# Patient Record
Sex: Male | Born: 1985 | Race: White | Hispanic: No | Marital: Single | Smoking: Former smoker
Health system: Southern US, Community
[De-identification: ages and names within clinical notes are randomized; demographics above are authoritative.]

## PROBLEM LIST (undated history)

## (undated) DIAGNOSIS — F17200 Nicotine dependence, unspecified, uncomplicated: Secondary | ICD-10-CM

## (undated) DIAGNOSIS — N200 Calculus of kidney: Secondary | ICD-10-CM

## (undated) DIAGNOSIS — T7840XA Allergy, unspecified, initial encounter: Secondary | ICD-10-CM

## (undated) HISTORY — DX: Nicotine dependence, unspecified, uncomplicated: F17.200

## (undated) HISTORY — PX: HYDROCELE EXCISION: SHX482

## (undated) HISTORY — DX: Allergy, unspecified, initial encounter: T78.40XA

---
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
## 2006-08-23 ENCOUNTER — Emergency Department (HOSPITAL_COMMUNITY): Admission: EM | Admit: 2006-08-23 | Discharge: 2006-08-23 | Payer: Self-pay | Admitting: Emergency Medicine

## 2007-03-07 ENCOUNTER — Ambulatory Visit: Payer: Self-pay | Admitting: Family Medicine

## 2007-06-08 ENCOUNTER — Ambulatory Visit: Payer: Self-pay | Admitting: Family Medicine

## 2008-12-10 ENCOUNTER — Ambulatory Visit: Payer: Self-pay | Admitting: Family Medicine

## 2009-06-03 ENCOUNTER — Ambulatory Visit: Payer: Self-pay | Admitting: Family Medicine

## 2010-09-19 NOTE — Op Note (Signed)
[place]  Patient:    Gabriel Graham, Gabriel Graham                      MRN: [id number] Proc. Date: 11/27/99 Adm. Date:  [id number] Disc. Date: [id number] Attending:  Carlos Levering CC:         Arelia Longest. Zenaida Niece, M.D.                           Operative Report  PREOPERATIVE DIAGNOSES: 1. Large right hydrocele. 2. Possible left hydrocele.  POSTOPERATIVE DIAGNOSES: 1. Large right hydrocele. 2. No evidence of hydrocele on left side, clinically.  OPERATION PERFORMED:  Exploration of right groin, and repair of right hydrocele.  SURGEON:  Dr. Levie Heritage.  ASSISTANT:  Dr. Leeanne Mannan.  ANESTHESIA:  Nurse.  OPERATIVE FINDINGS:  There was a huge right hydrocele measuring about 6 inches x 4 inches with thickened hydrocele sac.  No definite communicating processus vaginalis were identified.  There was no evidence of hernia.  Grossly, testicle appeared normal, and no other abnormalities noted.  DESCRIPTION OF PROCEDURE:  Under satisfactory general anesthesia the patient in supine position, the abdomen and groin regions were thoroughly prepped and draped in the usual manner.  About a 3 inch long transverse incision was made in the right groin and distal skin crease.  Skin and subcutaneous tissue incised.  Bleeders individually clamped, cut, and electrocoagulated.  External oblique opened.  The spermatic cord structures were dissected to identify any evidence of either patent processus vaginalis or hernia sac; none were seen. Distal dissection was carried out to explore the superior pole of the hydrocele.  At this time a needle was introduced and decompression of the large hydrocele was carried out.  About 150 cc of yellowish clear fluid was obtained.  Now the testicle was delivered out of the scrotal sac, and the hydrocele sac was opened.  The excess of the hydrocele sac was excised.  The cut edges were now imbricated with running 4-0 silk interlocking  sutures. Hemostasis was satisfactory.  The area was irrigated.  Testicle returned to the right scrotal pouch.  The inguinal canal was modified Fergusons method with #35 wire interrupted sutures.  Marcaine 0.25% with epinephrine was injected locally for postoperative analgesia.  Subcutaneous with 4-0 Vicryl. Skin closed with 4-0 Monocryl subcuticular sutures.  Steri-Strips applied. Dressing applied.  Throughout the procedure the patients vital signs remained stable.  The patient withstood the procedure well, and was transferred to the recovery room in satisfactory general condition. DD:  11/27/99 TD:  11/28/99 Job: [id number]

## 2011-01-30 ENCOUNTER — Ambulatory Visit (INDEPENDENT_AMBULATORY_CARE_PROVIDER_SITE_OTHER): Payer: Self-pay | Admitting: Family Medicine

## 2011-01-30 ENCOUNTER — Encounter: Payer: Self-pay | Admitting: Family Medicine

## 2011-01-30 DIAGNOSIS — R079 Chest pain, unspecified: Secondary | ICD-10-CM

## 2011-01-30 NOTE — Progress Notes (Signed)
  Subjective:    Patient ID: Gabriel Graham, male    DOB: 1986/01/13, 25 y.o.   MRN: [id number]  HPI Occult this morning with bilateral lower pleuritic type chest pain. Right side slightly worse than left. He's also had some fever and chills but no sore throat, earache, nasal congestion or shortness of breath. He does smoke but has been unable to due to pain.   Review of Systems     Objective:   Physical Exam alert and in no distress. Tympanic membranes and canals are normal. Throat is clear. Tonsils are normal. Neck is supple without adenopathy or thyromegaly. Cardiac exam shows a regular sinus rhythm without murmurs or gallops. Lungs are clear to auscultation. Chest x-ray shows no evidence of pneumothorax or pneumonia.       Assessment & Plan:   1. Chest pain    Supportive care including Advil. He will call me if continued difficulty or worsening of symptoms

## 2011-01-30 NOTE — Patient Instructions (Signed)
Take Advil 4 tablets 3 times per day for the pain. If he gets worse including short of breath called

## 2011-02-02 ENCOUNTER — Emergency Department (HOSPITAL_COMMUNITY)
Admission: EM | Admit: 2011-02-02 | Discharge: 2011-02-02 | Disposition: A | Discharge status: Home / Self Care | Payer: 59 | Attending: Emergency Medicine | Admitting: Emergency Medicine

## 2011-02-02 ENCOUNTER — Emergency Department (HOSPITAL_COMMUNITY): Payer: 59

## 2011-02-02 DIAGNOSIS — R1031 Right lower quadrant pain: Secondary | ICD-10-CM | POA: Insufficient documentation

## 2011-02-02 DIAGNOSIS — R10812 Left upper quadrant abdominal tenderness: Secondary | ICD-10-CM | POA: Insufficient documentation

## 2011-02-02 DIAGNOSIS — M549 Dorsalgia, unspecified: Secondary | ICD-10-CM | POA: Insufficient documentation

## 2011-02-02 LAB — DIFFERENTIAL
Basophils Absolute: 0 10*3/uL (ref 0.0–0.1)
Basophils Relative: 1 % (ref 0–1)
Eosinophils Absolute: 0.2 10*3/uL (ref 0.0–0.7)
Eosinophils Relative: 3 % (ref 0–5)
Lymphocytes Relative: 22 % (ref 12–46)
Lymphs Abs: 1.2 10*3/uL (ref 0.7–4.0)
Monocytes Absolute: 0.5 10*3/uL (ref 0.1–1.0)
Monocytes Relative: 10 % (ref 3–12)
Neutro Abs: 3.6 10*3/uL (ref 1.7–7.7)
Neutrophils Relative %: 65 % (ref 43–77)

## 2011-02-02 LAB — CBC
HCT: 45.2 % (ref 39.0–52.0)
Hemoglobin: 15.7 g/dL (ref 13.0–17.0)
MCH: 30.2 pg (ref 26.0–34.0)
MCHC: 34.7 g/dL (ref 30.0–36.0)
MCV: 86.9 fL (ref 78.0–100.0)
Platelets: 121 10*3/uL — ABNORMAL LOW (ref 150–400)
RBC: 5.2 MIL/uL (ref 4.22–5.81)
RDW: 13.1 % (ref 11.5–15.5)
WBC: 5.5 10*3/uL (ref 4.0–10.5)

## 2011-02-02 LAB — URINALYSIS, ROUTINE W REFLEX MICROSCOPIC
Bilirubin Urine: NEGATIVE
Glucose, UA: NEGATIVE mg/dL
Hgb urine dipstick: NEGATIVE
Ketones, ur: NEGATIVE mg/dL
Leukocytes, UA: NEGATIVE
Nitrite: NEGATIVE
Protein, ur: NEGATIVE mg/dL
Specific Gravity, Urine: 1.028 (ref 1.005–1.030)
Urobilinogen, UA: 0.2 mg/dL (ref 0.0–1.0)
pH: 6.5 (ref 5.0–8.0)

## 2011-02-03 LAB — COMPREHENSIVE METABOLIC PANEL
ALT: 18 U/L (ref 0–53)
AST: 21 U/L (ref 0–37)
Albumin: 4.5 g/dL (ref 3.5–5.2)
Alkaline Phosphatase: 59 U/L (ref 39–117)
BUN: 18 mg/dL (ref 6–23)
CO2: 30 mEq/L (ref 19–32)
Calcium: 9.6 mg/dL (ref 8.4–10.5)
Chloride: 98 mEq/L (ref 96–112)
Creatinine, Ser: 0.68 mg/dL (ref 0.50–1.35)
GFR calc Af Amer: >90 mL/min (ref >90)
GFR calc non Af Amer: >90 mL/min (ref >90)
Glucose, Bld: 86 mg/dL (ref 70–99)
Potassium: 4.1 mEq/L (ref 3.5–5.1)
Sodium: 138 mEq/L (ref 135–145)
Total Bilirubin: 0.3 mg/dL (ref 0.3–1.2)
Total Protein: 7.4 g/dL (ref 6.0–8.3)

## 2011-02-03 LAB — D-DIMER, QUANTITATIVE: D-Dimer, Quant: <0.22 ug/mL-FEU (ref 0.00–0.48)

## 2011-02-03 LAB — LIPASE, BLOOD: Lipase: 51 U/L (ref 11–59)

## 2011-03-18 ENCOUNTER — Encounter: Payer: Self-pay | Admitting: Family Medicine

## 2011-08-06 ENCOUNTER — Telehealth: Payer: Self-pay | Admitting: Internal Medicine

## 2011-08-06 ENCOUNTER — Ambulatory Visit (INDEPENDENT_AMBULATORY_CARE_PROVIDER_SITE_OTHER): Payer: 59 | Admitting: Family Medicine

## 2011-08-06 ENCOUNTER — Encounter: Payer: Self-pay | Admitting: Family Medicine

## 2011-08-06 VITALS — BP 118/70 | HR 90 | Temp 98.8°F | Wt 124.0 lb

## 2011-08-06 DIAGNOSIS — Z7189 Other specified counseling: Secondary | ICD-10-CM

## 2011-08-06 DIAGNOSIS — J301 Allergic rhinitis due to pollen: Secondary | ICD-10-CM

## 2011-08-06 DIAGNOSIS — F17200 Nicotine dependence, unspecified, uncomplicated: Secondary | ICD-10-CM

## 2011-08-06 DIAGNOSIS — J209 Acute bronchitis, unspecified: Secondary | ICD-10-CM

## 2011-08-06 DIAGNOSIS — Z630 Problems in relationship with spouse or partner: Secondary | ICD-10-CM

## 2011-08-06 MED ORDER — AZITHROMYCIN 500 MG PO TABS: 500.0000 mg | ORAL_TABLET | Freq: Every day | ORAL | Status: AC

## 2011-08-06 MED ORDER — AZITHROMYCIN 500 MG PO TABS: 500.0000 mg | ORAL_TABLET | Freq: Every day | ORAL | Status: DC

## 2011-08-06 MED ORDER — FLUTICASONE PROPIONATE 50 MCG/ACT NA SUSP: 2.0000 | Freq: Every day | NASAL | Status: DC

## 2011-08-06 NOTE — Progress Notes (Signed)
  Subjective:    Patient ID: Gabriel Graham, male    DOB: [birth year], 26 y.o.   MRN: [id number]  HPI 4 days ago he started having difficulty with sneezing, itchy watery eyes and runny nose. 2 days ago he developed sinus congestion with headache. Yesterday he developed a cough and today it's productive  Fatigue. He has these symptoms every spring and fall . He does smoke. He is involved in counseling dealing with some marital distress. He seems to be handling this quite well.  Review of Systems     Objective:   Physical Exam alert and in no distress. Tympanic membranes and canals are normal. Throat is clear. Tonsils are normal. Neck is supple without adenopathy or thyromegaly. Cardiac exam shows a regular sinus rhythm without murmurs or gallops. Lungs are clear to auscultation.        Assessment & Plan:   1. Allergic rhinitis due to pollen  fluticasone (FLONASE) 50 MCG/ACT nasal spray  2. Bronchitis, acute  azithromycin (ZITHROMAX) 500 MG tablet  3. Current smoker    4. Marital stress     he will continue in counseling. I encouraged him to do this. He will let him know if the cough and allergy symptoms continue. Discussed smoking cessation and at this point he is not ready to quit.

## 2011-08-06 NOTE — Patient Instructions (Signed)
Take the antibiotic and let me know how you doing in about 10 days if you're not totally back to normal I'll give more

## 2011-08-06 NOTE — Telephone Encounter (Signed)
flonase nasal spray and z-pak was sent to the wrong pharmacy. Reordered them and sent them to [facility] on [address].

## 2013-04-01 ENCOUNTER — Emergency Department (HOSPITAL_COMMUNITY)
Admission: EM | Admit: 2013-04-01 | Discharge: 2013-04-01 | Payer: 59 | Attending: Emergency Medicine | Admitting: Emergency Medicine

## 2013-04-01 ENCOUNTER — Emergency Department (HOSPITAL_COMMUNITY): Payer: 59

## 2013-04-01 ENCOUNTER — Encounter (HOSPITAL_COMMUNITY): Payer: Self-pay | Admitting: Emergency Medicine

## 2013-04-01 DIAGNOSIS — L03211 Cellulitis of face: Secondary | ICD-10-CM | POA: Insufficient documentation

## 2013-04-01 DIAGNOSIS — Z882 Allergy status to sulfonamides status: Secondary | ICD-10-CM | POA: Insufficient documentation

## 2013-04-01 DIAGNOSIS — Z9109 Other allergy status, other than to drugs and biological substances: Secondary | ICD-10-CM | POA: Insufficient documentation

## 2013-04-01 DIAGNOSIS — J029 Acute pharyngitis, unspecified: Secondary | ICD-10-CM | POA: Insufficient documentation

## 2013-04-01 DIAGNOSIS — L0201 Cutaneous abscess of face: Secondary | ICD-10-CM | POA: Insufficient documentation

## 2013-04-01 DIAGNOSIS — Z9889 Other specified postprocedural states: Secondary | ICD-10-CM | POA: Insufficient documentation

## 2013-04-01 DIAGNOSIS — K122 Cellulitis and abscess of mouth: Secondary | ICD-10-CM

## 2013-04-01 DIAGNOSIS — Z87891 Personal history of nicotine dependence: Secondary | ICD-10-CM | POA: Insufficient documentation

## 2013-04-01 DIAGNOSIS — Z79899 Other long term (current) drug therapy: Secondary | ICD-10-CM | POA: Insufficient documentation

## 2013-04-01 LAB — BASIC METABOLIC PANEL
BUN: 9 mg/dL (ref 6–23)
CO2: 28 mEq/L (ref 19–32)
Calcium: 8.9 mg/dL (ref 8.4–10.5)
Chloride: 100 mEq/L (ref 96–112)
Creatinine, Ser: 0.63 mg/dL (ref 0.50–1.35)
GFR calc Af Amer: >90 mL/min (ref >90)
GFR calc non Af Amer: >90 mL/min (ref >90)
Glucose, Bld: 85 mg/dL (ref 70–99)
Potassium: 4.2 mEq/L (ref 3.5–5.1)
Sodium: 138 mEq/L (ref 135–145)

## 2013-04-01 LAB — CBC
HCT: 40 % (ref 39.0–52.0)
Hemoglobin: 13.8 g/dL (ref 13.0–17.0)
MCH: 30.3 pg (ref 26.0–34.0)
MCHC: 34.5 g/dL (ref 30.0–36.0)
MCV: 87.7 fL (ref 78.0–100.0)
Platelets: 157 10*3/uL (ref 150–400)
RBC: 4.56 MIL/uL (ref 4.22–5.81)
RDW: 12.8 % (ref 11.5–15.5)
WBC: 8.8 10*3/uL (ref 4.0–10.5)

## 2013-04-01 MED ORDER — ONDANSETRON HCL 4 MG/2ML IJ SOLN
4.0000 mg | Freq: Once | INTRAMUSCULAR | Status: AC
  Administered 2013-04-01: 4 mg via INTRAVENOUS
  Filled 2013-04-01: qty 2

## 2013-04-01 MED ORDER — IOHEXOL 300 MG/ML  SOLN
75.0000 mL | Freq: Once | INTRAMUSCULAR | Status: AC | PRN
  Administered 2013-04-01: 75 mL via INTRAVENOUS

## 2013-04-01 MED ORDER — SODIUM CHLORIDE 0.9 % IV BOLUS (SEPSIS)
1000.0000 mL | Freq: Once | INTRAVENOUS | Status: AC
  Administered 2013-04-01: 1000 mL via INTRAVENOUS

## 2013-04-01 MED ORDER — ACETAMINOPHEN 325 MG PO TABS: 325.0000 mg | ORAL_TABLET | Freq: Once | ORAL | Status: DC

## 2013-04-01 MED ORDER — MORPHINE SULFATE 4 MG/ML IJ SOLN
4.0000 mg | Freq: Once | INTRAMUSCULAR | Status: AC
  Administered 2013-04-01: 4 mg via INTRAVENOUS
  Filled 2013-04-01: qty 1

## 2013-04-01 MED ORDER — METRONIDAZOLE IN NACL 5-0.79 MG/ML-% IV SOLN
500.0000 mg | Freq: Once | INTRAVENOUS | Status: AC
  Administered 2013-04-01: 500 mg via INTRAVENOUS
  Filled 2013-04-01: qty 100

## 2013-04-01 MED ORDER — HYDROCHLOROTHIAZIDE 25 MG PO TABS: 25.0000 mg | ORAL_TABLET | Freq: Every day | ORAL | Status: DC

## 2013-04-01 MED ORDER — PIPERACILLIN-TAZOBACTAM 3.375 G IVPB 30 MIN
3.3750 g | Freq: Once | INTRAVENOUS | Status: AC
  Administered 2013-04-01: 3.375 g via INTRAVENOUS
  Filled 2013-04-01: qty 50

## 2013-04-01 MED ORDER — MORPHINE SULFATE 4 MG/ML IJ SOLN
4.0000 mg | Freq: Once | INTRAMUSCULAR | Status: DC
  Filled 2013-04-01: qty 1

## 2013-04-01 MED ORDER — HYDROMORPHONE HCL PF 1 MG/ML IJ SOLN
1.0000 mg | Freq: Once | INTRAMUSCULAR | Status: AC
Start: 2013-04-01 — End: 2013-04-01
  Administered 2013-04-01: 1 mg via INTRAVENOUS
  Filled 2013-04-01: qty 1

## 2013-04-01 MED ORDER — ONDANSETRON HCL 4 MG/2ML IJ SOLN
4.0000 mg | Freq: Once | INTRAMUSCULAR | Status: DC
  Filled 2013-04-01: qty 2

## 2013-04-01 MED ORDER — HYDROMORPHONE HCL PF 1 MG/ML IJ SOLN
0.5000 mg | Freq: Once | INTRAMUSCULAR | Status: AC
  Administered 2013-04-01: 0.5 mg via INTRAVENOUS
  Filled 2013-04-01: qty 1

## 2013-04-01 NOTE — ED Notes (Signed)
Patient transported to CT 

## 2013-04-01 NOTE — ED Notes (Signed)
The pt denies getting any relief from the dilaudid.  Generally  Unhappy and feeling very bad

## 2013-04-01 NOTE — ED Notes (Signed)
[facility] here and transferred  In progress.  Report has been given to the chg rn rhonda at [facility]..  Pt alert talking and texting on his cell just prior to transfer.  No distress

## 2013-04-01 NOTE — ED Notes (Signed)
Pt was sent here for spreading dental infection and has recently been on po anbx and pain meds.  Please see note that accompanies patient

## 2013-04-01 NOTE — ED Notes (Signed)
Pt c/o pain  Med given 

## 2013-04-01 NOTE — ED Notes (Signed)
The pt is alert and he is requesting something for pain  For the pain in his rt jaw

## 2013-04-01 NOTE — ED Notes (Signed)
Pt getting ready for transfer [facility] for oral surgery consult.

## 2013-04-01 NOTE — ED Notes (Signed)
Report to [facility].  

## 2013-04-01 NOTE — ED Provider Notes (Signed)
CSN: [id number]     Arrival date & time 04/01/13  1223 History   First MD Initiated Contact with Patient 04/01/13 1233     Chief Complaint  Patient presents with  . Dental Problem   (Consider location/radiation/quality/duration/timing/severity/associated sxs/prior Treatment) The history is provided by the patient. No language interpreter was used.  Gabriel Graham is a 27 y/o M with no significant PMHx presenting to the ED with dental pain. Patient reported that he has been having an ongoing, intermittent abscess to the right mandible that has been ongoing for the past year. Reported that he has been taking medications and antibiotics for the abscess that continuously comes and goes. Patient reported that on Monday he saw a dentist who started him on Clindamycin, patient reported back to the dentist on Tuesday where tooth #31 was extracted. Patient reported that the pain increased and the patient went back to the dentist today who recommended that the patient come to the ED. Patient reported that he has noticed swelling to his right submandibular region with increased. Patient reported that there is a constant throbbing sensation localized to the right submandibular region. Patient reported that the site is tender to touch. Patient reported that there is mild radiation of pain to the right side of the neck. Reported that he has pain when swallowing and finds it difficult to speak secondary to pain to the right side. Denied fever, chills, difficulty swallowing, numbness, tingling, visual changes, bleeding or drainage from site.  PCP Dr. Susann Givens Dentist Dr. Nuala Alpha  Past Medical History  Diagnosis Date  . Allergy     RHINITIS  . Smoker    History reviewed. No pertinent past surgical history. Family History  Problem Relation Age of Onset  . Diabetes Maternal Grandmother   . Emphysema Paternal Grandmother    History  Substance Use Topics  . Smoking status: Former Smoker -- 1.00 packs/day     Types: Cigarettes  . Smokeless tobacco: Never Used  . Alcohol Use: Yes    Review of Systems  Constitutional: Negative for fever and chills.  HENT: Positive for dental problem and sore throat. Negative for trouble swallowing.   All other systems reviewed and are negative.    Allergies  Sulfa antibiotics  Home Medications   Current Outpatient Rx  Name  Route  Sig  Dispense  Refill  . clindamycin (CLEOCIN) 150 MG capsule   Oral   Take 450 mg by mouth 3 (three) times daily.         Marland Kitchen HYDROcodone-acetaminophen (NORCO/VICODIN) 5-325 MG per tablet   Oral   Take 2 tablets by mouth every 8 (eight) hours as needed for moderate pain.         Marland Kitchen ibuprofen (ADVIL,MOTRIN) 200 MG tablet   Oral   Take 600 mg by mouth every 6 (six) hours as needed for moderate pain.          BP 125/69  Pulse 94  Temp(Src) 100.1 F (37.8 C) (Oral)  Resp 18  Wt 124 lb 5 oz (56.388 kg)  SpO2 98% Physical Exam  Nursing note and vitals reviewed. Constitutional: He is oriented to person, place, and time. He appears well-developed and well-nourished. No distress.  HENT:  Head: Normocephalic and atraumatic.  Mild facial swelling localized to the right submandibular region. Negative erythema, inflammation, warmth upon palpation. Pain upon palpation noted. Mild trismus. Discomfort upon palpation to the right TMJ.  Negative oral lesions noted. Negative sublingual lesions noted. Tooth #31 noted to  be extracted with negative swelling, erythema, inflammation, lesions, sores noted. Negative active drainage or bleeding noted. Negative active drooling.   Eyes: Conjunctivae and EOM are normal. Pupils are equal, round, and reactive to light. Right eye exhibits no discharge. Left eye exhibits no discharge.  Neck: Neck supple.    Decreased ROM with rotation to the neck with extension and flexion secondary to pain  Negative cervical lymphadenopathy  Cardiovascular: Normal rate, regular rhythm and normal heart  sounds.   Pulmonary/Chest: Effort normal and breath sounds normal. No respiratory distress. He has no wheezes. He has no rales.  Airway intact Negative respiratory distress noted Patient able to speak in full sentences without difficulty  Musculoskeletal: Normal range of motion.  Lymphadenopathy:    He has no cervical adenopathy.  Neurological: He is alert and oriented to person, place, and time. No cranial nerve deficit. He exhibits normal muscle tone. Coordination normal.  Cranial nerves III-XII grossly intact   Skin: Skin is warm and dry. No rash noted. He is not diaphoretic. No erythema.  Psychiatric: He has a normal mood and affect. His behavior is normal. Thought content normal.    ED Course  Procedures (including critical care time)  3:51 PM This provider spoke with the patient regarding findings on the CT scan - discussed with patient that will consult with oral surgeon.   This provider has been back and forth between dental and ENT. Due to no oral surgeon being on-call in the hospital. At first spoke with Dr. Teola Bradley, general dentist, who reported to admit patient to medicine and that he will see the patient in the morning. Patient demanding to see an oral surgeon today - family calling to see if their oral surgeon can come into the hospital. This provider spoke with Dr. Annalee Genta, ENT, who reported that this is an oral surgery issue and that patient needs to be seen by oral surgery, not ENT. Discussed this with Dr. Jinger Neighbors. Dr. Jinger Neighbors spoke with [facility] who has an oral surgeon on-call.   5:03 PM Dr. Jinger Neighbors spoke with oral surgeon on-call at [facility], oral surgeon agreed to see patient. Patient to be transferred over to [facility].   5:10 PM Dr. Jinger Neighbors spoke with [facility] for admission of patient. Patient to be transferred to [facility] for admission and to be seen by their on-call oral surgeon today.   5:27 PM Discussed  with patient plan for transfer - patient understood and agreed to plan.   Results for orders placed during the hospital encounter of 04/01/13  CBC      Result Value Range   WBC 8.8  4.0 - 10.5 K/uL   RBC 4.56  4.22 - 5.81 MIL/uL   Hemoglobin 13.8  13.0 - 17.0 g/dL   HCT 19.1  47.8 - 29.5 %   MCV 87.7  78.0 - 100.0 fL   MCH 30.3  26.0 - 34.0 pg   MCHC 34.5  30.0 - 36.0 g/dL   RDW 62.1  30.8 - 65.7 %   Platelets 157  150 - 400 K/uL  BASIC METABOLIC PANEL      Result Value Range   Sodium 138  135 - 145 mEq/L   Potassium 4.2  3.5 - 5.1 mEq/L   Chloride 100  96 - 112 mEq/L   CO2 28  19 - 32 mEq/L   Glucose, Bld 85  70 - 99 mg/dL   BUN 9  6 - 23 mg/dL   Creatinine, Ser 1.47  0.50 - 1.35 mg/dL   Calcium 8.9  8.4 - 82.9 mg/dL   GFR calc non Af Amer >90  >90 mL/min   GFR calc Af Amer >90  >90 mL/min   Ct Soft Tissue Neck W Contrast  04/01/2013   CLINICAL DATA:  Right jaw/neck pain, dental infection  EXAM: CT NECK WITH CONTRAST  TECHNIQUE: Multidetector CT imaging of the neck was performed using the standard protocol following the bolus administration of intravenous contrast.  CONTRAST:  75mL OMNIPAQUE IOHEXOL 300 MG/ML  SOLN  COMPARISON:  None.  FINDINGS: Right lower 2nd molar (tooth #31) has been extracted.  Associated ill-defined 1.7 x 3.5 cm odontogenic abscess along the right lower jaw (series 3/image 57). Fistulous tract extends from the abscess warranted the submental region (series 3/ image 63), but does not currently extend to the cutaneous surface. Associated subcutaneous edema along the right floor of the mouth.  Airway remains patent.  No evidence of peritonsillar abscess.  Minimal opacification of the right maxillary sinus. Mastoid air cells are clear.  Visualized brain parenchyma is within normal limits.  Bilateral orbits, including the globes and retrocrural soft tissues, are within normal limits.  Thyroid is within normal limits.  Cervical spine is notable for reversal of the  normal cervical lordosis but is otherwise within normal limits.  Visualized lung apices are notable for mild paraseptal emphysematous changes.  IMPRESSION: Right lower 2nd molar (tooth #31) has been extracted.  Associated ill-defined 1.7 x 3.5 cm odontogenic abscess along the right lower jaw, as described above.   Electronically Signed   By: Charline Bills M.D.   On: 04/01/2013 15:16    Labs Review Labs Reviewed  CBC  BASIC METABOLIC PANEL   Imaging Review Ct Soft Tissue Neck W Contrast  04/01/2013   CLINICAL DATA:  Right jaw/neck pain, dental infection  EXAM: CT NECK WITH CONTRAST  TECHNIQUE: Multidetector CT imaging of the neck was performed using the standard protocol following the bolus administration of intravenous contrast.  CONTRAST:  75mL OMNIPAQUE IOHEXOL 300 MG/ML  SOLN  COMPARISON:  None.  FINDINGS: Right lower 2nd molar (tooth #31) has been extracted.  Associated ill-defined 1.7 x 3.5 cm odontogenic abscess along the right lower jaw (series 3/image 57). Fistulous tract extends from the abscess warranted the submental region (series 3/ image 63), but does not currently extend to the cutaneous surface. Associated subcutaneous edema along the right floor of the mouth.  Airway remains patent.  No evidence of peritonsillar abscess.  Minimal opacification of the right maxillary sinus. Mastoid air cells are clear.  Visualized brain parenchyma is within normal limits.  Bilateral orbits, including the globes and retrocrural soft tissues, are within normal limits.  Thyroid is within normal limits.  Cervical spine is notable for reversal of the normal cervical lordosis but is otherwise within normal limits.  Visualized lung apices are notable for mild paraseptal emphysematous changes.  IMPRESSION: Right lower 2nd molar (tooth #31) has been extracted.  Associated ill-defined 1.7 x 3.5 cm odontogenic abscess along the right lower jaw, as described above.   Electronically Signed   By: Charline Bills  M.D.   On: 04/01/2013 15:16    EKG Interpretation   None       MDM   1. Submandibular abscess    Medications  sodium chloride 0.9 % bolus 1,000 mL (1,000 mLs Intravenous New Bag/Given 04/01/13 1343)  morphine 4 MG/ML injection 4 mg (4  mg Intravenous Given 04/01/13 1343)  ondansetron (ZOFRAN) injection 4 mg (4 mg Intravenous Given 04/01/13 1343)  iohexol (OMNIPAQUE) 300 MG/ML solution 75 mL (75 mLs Intravenous Contrast Given 04/01/13 1426)  piperacillin-tazobactam (ZOSYN) IVPB 3.375 g (0 g Intravenous Stopped 04/01/13 1633)  metroNIDAZOLE (FLAGYL) IVPB 500 mg (0 mg Intravenous Stopped 04/01/13 1721)  HYDROmorphone (DILAUDID) injection 0.5 mg (0.5 mg Intravenous Given 04/01/13 1600)  HYDROmorphone (DILAUDID) injection 1 mg (1 mg Intravenous Given 04/01/13 1646)   Filed Vitals:   04/01/13 1229 04/01/13 1330 04/01/13 1607 04/01/13 1720  BP: 150/80 141/96 135/80 125/69  Pulse: 94 81 74 94  Temp: 97.9 F (36.6 C)  98.4 F (36.9 C) 100.1 F (37.8 C)  TempSrc: Oral     Resp: 18  18 18   Weight: 124 lb 5 oz (56.388 kg)     SpO2: 99% 100% 99% 98%    Patient presenting to the ED with right submandibular pain that has been ongoing for the past 2 days with worsening today. Patient reported that he had tooth #31 extracted on Tuesday - reported that he has developed swelling to the right submandibular region that has gotten progressively worse. Patient saw dentist today (Dr. Loni Beckwith) who recommended patient to come to the ED today for further evaluation.  Alert and oriented. GCS 15. Heart rate and rhythm normal. Lungs clear to auscultation bilaterally. Decreased ROM to the neck with flexion and extension secondary to pain. Right submandibular swelling noted with pain upon palpation. Negative cellulitic infection noted. Mild trismus. Negative active drooling. Negative facial swelling noted. Negative sublingual lesions noted. Cranial nerves grossly intact.  CBC negative elevation in WBC. BMP  negative findings. CT scan of soft tissue neck noted right submandibular abscess measuring 1.7 x 3.5 cm.  This provider spoke with general dentist who was going to have patient admitted to the hospital by internal medicine and general dentistry will see patient in morning. Patient requesting to see oral surgeon tonight. No on-call oral surgeon this weekend - this provider spoke with ENT who refused, reported that this is strictly an oral surgery case. This provider spoke with Dr. Jinger Neighbors. Attending spoke with the patient, the patient reported that he has gotten in contact with family oral surgeon who reported that there is an Transport planner at [facility]. Attending physician spoke with oral surgeon on-call at [facility] - on-call oral surgeon agreed to see patient. Attending physician spoke with [facility] regarding admission - [facility] to admit patient and oral surgeon on-call at [facility] to see patient. Discussed plan for transfer with patient. Patient agreed to plan of care, understood. Patient stable, for transfer.   Raymon Mutton, PA-C 04/02/13 1341

## 2013-04-01 NOTE — ED Provider Notes (Signed)
Medical screening examination/treatment/procedure(s) were conducted as a shared visit with non-physician practitioner(s) and myself.  I personally evaluated the patient during the encounter.  EKG Interpretation   None      No oral surgeon available at [facility]; offered Med admit and Dental consult at [facility]; PA d/w ENT at [facility] but doesn't cover oral surgery for this; Pt requested transfer to [facility]; d/w oral surgeon on-call for [facility] accepted as consult and admit to [facility] accepted by Dr. Harvie Junior for transfer.  No stridor no drooling no airway compromise in the emergency department, no dysphonia, feel patient stable airway for transfer.  Hurman Horn, MD 04/02/13 [phone]

## 2013-04-10 ENCOUNTER — Telehealth: Payer: Self-pay | Admitting: Family Medicine

## 2013-04-10 ENCOUNTER — Inpatient Hospital Stay: Payer: Self-pay | Admitting: Family Medicine

## 2013-04-10 NOTE — Telephone Encounter (Signed)
lm

## 2014-10-11 ENCOUNTER — Emergency Department (HOSPITAL_BASED_OUTPATIENT_CLINIC_OR_DEPARTMENT_OTHER): Payer: Commercial Managed Care - PPO

## 2014-10-11 ENCOUNTER — Encounter (HOSPITAL_BASED_OUTPATIENT_CLINIC_OR_DEPARTMENT_OTHER): Payer: Self-pay

## 2014-10-11 ENCOUNTER — Emergency Department (HOSPITAL_BASED_OUTPATIENT_CLINIC_OR_DEPARTMENT_OTHER)
Admission: EM | Admit: 2014-10-11 | Discharge: 2014-10-12 | Disposition: A | Discharge status: Home / Self Care | Payer: Commercial Managed Care - PPO | Attending: Emergency Medicine | Admitting: Emergency Medicine

## 2014-10-11 DIAGNOSIS — R1032 Left lower quadrant pain: Secondary | ICD-10-CM | POA: Diagnosis present

## 2014-10-11 DIAGNOSIS — Z87891 Personal history of nicotine dependence: Secondary | ICD-10-CM | POA: Diagnosis not present

## 2014-10-11 DIAGNOSIS — Z792 Long term (current) use of antibiotics: Secondary | ICD-10-CM | POA: Insufficient documentation

## 2014-10-11 DIAGNOSIS — N50812 Left testicular pain: Secondary | ICD-10-CM

## 2014-10-11 DIAGNOSIS — N508 Other specified disorders of male genital organs: Secondary | ICD-10-CM | POA: Diagnosis not present

## 2014-10-11 DIAGNOSIS — N433 Hydrocele, unspecified: Secondary | ICD-10-CM | POA: Diagnosis not present

## 2014-10-11 LAB — URINALYSIS, ROUTINE W REFLEX MICROSCOPIC
Bilirubin Urine: NEGATIVE
Glucose, UA: NEGATIVE mg/dL
Hgb urine dipstick: NEGATIVE
Ketones, ur: NEGATIVE mg/dL
Leukocytes, UA: NEGATIVE
Nitrite: NEGATIVE
Protein, ur: NEGATIVE mg/dL
Specific Gravity, Urine: 1.024 (ref 1.005–1.030)
Urobilinogen, UA: 1 mg/dL (ref 0.0–1.0)
pH: 7 (ref 5.0–8.0)

## 2014-10-11 NOTE — ED Notes (Signed)
Pt c/o a sharp pain off and on for 3-4wks to LLQ and lt testicle area; denies injury or n/v/d

## 2014-10-11 NOTE — ED Notes (Signed)
C/o left lower abd pain off and on x 2-3 weeks and left testiculat pain   Denies inj  No n/v/d

## 2014-10-11 NOTE — ED Provider Notes (Signed)
TIME SEEN: This chart was scribed for Layla Maw Alwilda Gilland, DO by Octavia Heir, ED Scribe. This patient was seen in room MH07/MH07 and the patient's care was started at 10:19 PM.   CHIEF COMPLAINT: abdominal pain  HPI: HPI Comments: Gabriel Graham is a 29 y.o. male who presents to the Emergency Department complaining of constant, gradual worsening lower left abdominal pain onset 3 weeks ago. Pt has intermittent associated pain in his left testicle. Pt has no alleviating or modifying factors. Pt denies fever, nausea, vomiting, diarrhea, blood in stool, melena, penile discharge, testicular swelling or masses.  States he had a normal bowel movement today. No aggravating or relieving factors. Denies dysuria or hematuria.  ROS: See HPI Constitutional: no fever  Eyes: no drainage  ENT: no runny nose   Cardiovascular:  no chest pain  Resp: no SOB  GI: no vomiting GU: no dysuria Integumentary: no rash  Allergy: no hives  Musculoskeletal: no leg swelling  Neurological: no slurred speech ROS otherwise negative  PAST MEDICAL HISTORY/PAST SURGICAL HISTORY:  Past Medical History  Diagnosis Date  . Allergy     RHINITIS  . Smoker     MEDICATIONS:  Prior to Admission medications   Medication Sig Start Date End Date Taking? Authorizing Provider  clindamycin (CLEOCIN) 150 MG capsule Take 450 mg by mouth 3 (three) times daily.    Historical Provider, MD  HYDROcodone-acetaminophen (NORCO/VICODIN) 5-325 MG per tablet Take 2 tablets by mouth every 8 (eight) hours as needed for moderate pain.    Historical Provider, MD  ibuprofen (ADVIL,MOTRIN) 200 MG tablet Take 600 mg by mouth every 6 (six) hours as needed for moderate pain.    Historical Provider, MD    ALLERGIES:  Allergies  Allergen Reactions  . Sulfa Antibiotics Anaphylaxis and Rash    SOCIAL HISTORY:  History  Substance Use Topics  . Smoking status: Former Smoker -- 1.00 packs/day    Types: Cigarettes  . Smokeless tobacco: Never Used  .  Alcohol Use: Yes    FAMILY HISTORY: Family History  Problem Relation Age of Onset  . Diabetes Maternal Grandmother   . Emphysema Paternal Grandmother     EXAM: Triage vitals: BP 119/66 mmHg  Pulse 72  Temp(Src) 97.7 F (36.5 C) (Oral)  Resp 18  Ht 5\' 9"  (1.753 m)  Wt 130 lb (58.968 kg)  BMI 19.19 kg/m2  SpO2 98% CONSTITUTIONAL: Alert and oriented and responds appropriately to questions. Well-appearing; well-nourished HEAD: Normocephalic EYES: Conjunctivae clear, PERRL ENT: normal nose; no rhinorrhea; moist mucous membranes; pharynx without lesions noted NECK: Supple, no meningismus, no LAD  CARD: RRR; S1 and S2 appreciated; no murmurs, no clicks, no rubs, no gallops RESP: Normal chest excursion without splinting or tachypnea; breath sounds clear and equal bilaterally; no wheezes, no rhonchi, no rales, no hypoxia or respiratory distress, speaking full sentences ABD/GI: Normal bowel sounds; non-distended; soft, non-tender, no rebound, no guarding, no peritoneal signs GU:  Normal external genitalia, circumcised male, normal urethral meatus without blood or discharge, normal penile shaft, no testicular masses or tenderness on exam, no high riding testicle, no scrotal masses, no hernia appreciated, 2+ femoral pulses bilaterally BACK:  The back appears normal and is non-tender to palpation, there is no CVA tenderness EXT: Normal ROM in all joints; non-tender to palpation; no edema; normal capillary refill; no cyanosis, no calf tenderness or swelling    SKIN: Normal color for age and race; warm NEURO: Moves all extremities equally, sensation to light touch intact diffusely,  cranial nerves II through XII intact PSYCH: The patient's mood and manner are appropriate. Grooming and personal hygiene are appropriate.  MEDICAL DECISION MAKING: Here with complaints of several weeks of left lower quadrant pain and intermittent testicular pain. His abdominal exam and GU exam are completely benign  currently. He is very well-appearing, nontoxic. Given he does have complaints of intermittent testicular pain will obtain scrotal ultrasound with Doppler. We'll also obtain urinalysis. He declines pain medication at this time.  ED PROGRESS: 12:00 AM  UA shows no sign of infection or hematuria. Ultrasound is pending. Signed out to Dr. Judd Lien to follow up on [place].  Anticipate if ultrasound is negative patient will be discharged home with outpatient follow-up.     I personally performed the services described in this documentation, which was scribed in my presence. The recorded information has been reviewed and is accurate.    Layla Maw Daelyn Pettaway, DO 10/11/14 2357

## 2014-10-12 NOTE — ED Provider Notes (Signed)
Gabriel Graham symptoms from Dr. Elesa Massed at shift change. Patient presents with complaints of testicle pain. Gabriel Graham urinalysis is clear and he is awaiting an ultrasound to further evaluate. He has a small hydrocele and small epididymal cyst, however nothing that appears emergent. I will recommend ibuprofen and when necessary follow-up with urology if not improving or worsening in the next week.  Gabriel Lyons, MD 10/12/14 [phone]

## 2014-10-12 NOTE — Discharge Instructions (Signed)
Ibuprofen 600 mg every 6 hours as needed for pain.  Follow-up with [facility] if not improving in the next week. The contact information has been provided in this discharge summary.  Return to the emergency department if symptoms significantly worsen.   Hydrocele, Adult Fluid can collect around the testicles. This fluid forms in a sac. This condition is called a hydrocele. The collected fluid causes swelling of the scrotum. Usually, it affects just one testicle. Most of the time, the condition does not cause pain. Sometimes, the hydrocele goes away on its own. Other times, surgery is needed to get rid of the fluid. CAUSES A hydrocele does not develop often. Different things can cause a hydrocele in a man, including:  Injury to the scrotum.  Infection.  X-ray of the area around the scrotum.  A tumor or cancer of the testicle.  Twisting of a testicle.  Decreased blood flow to the scrotum. SYMPTOMS   Swelling without pain. The hydrocele feels like a water-filled balloon.  Swelling with pain. This can occur if the hydrocele was caused by infection or twisting.  Mild discomfort in the scrotum.  The hydrocele may feel heavy.  Swelling that gets smaller when you lie down. DIAGNOSIS  Your caregiver will do a physical exam to decide if you have a hydrocele. This may include:  Asking questions about your overall health, today and in the past. Your caregiver may ask about any injuries, X-rays, or infections.  Pushing on your abdomen or asking you to change positions to see if the size of the hydrocele changes.  Shining a light through the scrotum (transillumination) to see if the fluid inside the scrotum is clear.  Blood tests and urine tests to check for infection.  Imaging studies that take pictures of the scrotum and testicles. TREATMENT  Treatment depends in part on what caused the condition. Options include:  Watchful waiting. Your caregiver checks the hydrocele every  so often.  Different surgeries to drain the fluid.  A needle may be put into the scrotum to drain fluid (needle aspiration). Fluid often returns after this type of treatment.  A cut (incision) may be made in the scrotum to remove the fluid sac (hydrocelectomy).  An incision may be made in the groin to repair a hydrocele that has contact with abdominal fluids (communicating hydrocele).  Medicines to treat an infection (antibiotics). HOME CARE INSTRUCTIONS  What you need to do at home may depend on the cause of the hydrocele and type of treatment. In general:  Take all medicine as directed by your caregiver. Follow the directions carefully.  Ask your caregiver if there is anything you should not do while you recover (activities, lifting, work, sex).  If you had surgery to repair a communicating hydrocele, recovery time may vary. Ask you caregiver about your recovery time.  Avoid heavy lifting for 4 to 6 weeks.  If you had an incision on the scrotum or groin, wash it for 2 to 3 days after surgery. Do this as long as the skin is closed and there are no gaps in the wound. Wash gently, and avoid rubbing the incision.  Keep all follow-up appointments. SEEK MEDICAL CARE IF:   Your scrotum seems to be getting larger.  The area becomes more and more uncomfortable. SEEK IMMEDIATE MEDICAL CARE IF:  You have a fever. Document Released: 10/08/2009 Document Revised: 02/08/2013 Document Reviewed: 10/08/2009 [facility] Patient Information 2015 [place], [place]. This information is not intended to replace advice given to you  by your health care provider. Make sure you discuss any questions you have with your health care provider. ° °

## 2015-04-30 ENCOUNTER — Encounter: Payer: Self-pay | Admitting: Family Medicine

## 2015-04-30 DIAGNOSIS — IMO0002 Reserved for concepts with insufficient information to code with codable children: Secondary | ICD-10-CM | POA: Insufficient documentation

## 2016-05-26 ENCOUNTER — Emergency Department (HOSPITAL_COMMUNITY)
Admission: EM | Admit: 2016-05-26 | Discharge: 2016-05-26 | Disposition: A | Discharge status: Home / Self Care | Payer: Commercial Managed Care - PPO | Attending: Emergency Medicine | Admitting: Emergency Medicine

## 2016-05-26 ENCOUNTER — Emergency Department (HOSPITAL_COMMUNITY): Payer: Commercial Managed Care - PPO

## 2016-05-26 ENCOUNTER — Encounter (HOSPITAL_COMMUNITY): Payer: Self-pay | Admitting: Emergency Medicine

## 2016-05-26 DIAGNOSIS — Z79899 Other long term (current) drug therapy: Secondary | ICD-10-CM | POA: Diagnosis not present

## 2016-05-26 DIAGNOSIS — R1110 Vomiting, unspecified: Secondary | ICD-10-CM | POA: Insufficient documentation

## 2016-05-26 DIAGNOSIS — N4400 Torsion of testis, unspecified: Secondary | ICD-10-CM

## 2016-05-26 DIAGNOSIS — N5082 Scrotal pain: Secondary | ICD-10-CM | POA: Insufficient documentation

## 2016-05-26 DIAGNOSIS — F17210 Nicotine dependence, cigarettes, uncomplicated: Secondary | ICD-10-CM | POA: Diagnosis not present

## 2016-05-26 DIAGNOSIS — R1111 Vomiting without nausea: Secondary | ICD-10-CM

## 2016-05-26 LAB — COMPREHENSIVE METABOLIC PANEL
ALT: 14 U/L — ABNORMAL LOW (ref 17–63)
AST: 18 U/L (ref 15–41)
Albumin: 5 g/dL (ref 3.5–5.0)
Alkaline Phosphatase: 58 U/L (ref 38–126)
Anion gap: 8 (ref 5–15)
BUN: 16 mg/dL (ref 6–20)
CO2: 31 mmol/L (ref 22–32)
Calcium: 9.5 mg/dL (ref 8.9–10.3)
Chloride: 102 mmol/L (ref 101–111)
Creatinine, Ser: 0.84 mg/dL (ref 0.61–1.24)
GFR calc Af Amer: >60 mL/min (ref >60)
GFR calc non Af Amer: >60 mL/min (ref >60)
Glucose, Bld: 97 mg/dL (ref 65–99)
Potassium: 4.4 mmol/L (ref 3.5–5.1)
Sodium: 141 mmol/L (ref 135–145)
Total Bilirubin: 0.5 mg/dL (ref 0.3–1.2)
Total Protein: 7.7 g/dL (ref 6.5–8.1)

## 2016-05-26 LAB — CBC
HCT: 46.7 % (ref 39.0–52.0)
Hemoglobin: 16 g/dL (ref 13.0–17.0)
MCH: 30.4 pg (ref 26.0–34.0)
MCHC: 34.3 g/dL (ref 30.0–36.0)
MCV: 88.8 fL (ref 78.0–100.0)
Platelets: 153 10*3/uL (ref 150–400)
RBC: 5.26 MIL/uL (ref 4.22–5.81)
RDW: 13.2 % (ref 11.5–15.5)
WBC: 15.7 10*3/uL — ABNORMAL HIGH (ref 4.0–10.5)

## 2016-05-26 LAB — URINALYSIS, ROUTINE W REFLEX MICROSCOPIC
Bilirubin Urine: NEGATIVE
Glucose, UA: NEGATIVE mg/dL
Ketones, ur: 20 mg/dL — AB
Leukocytes, UA: NEGATIVE
Nitrite: NEGATIVE
Protein, ur: NEGATIVE mg/dL
Specific Gravity, Urine: 1.017 (ref 1.005–1.030)
Squamous Epithelial / LPF: NONE SEEN
pH: 6 (ref 5.0–8.0)

## 2016-05-26 LAB — LIPASE, BLOOD: Lipase: 24 U/L (ref 11–51)

## 2016-05-26 MED ORDER — HYDROMORPHONE HCL 1 MG/ML IJ SOLN
1.0000 mg | Freq: Once | INTRAMUSCULAR | Status: AC
  Administered 2016-05-26: 1 mg via INTRAVENOUS
  Filled 2016-05-26: qty 1

## 2016-05-26 MED ORDER — IBUPROFEN 800 MG PO TABS: 800.0000 mg | ORAL_TABLET | Freq: Three times a day (TID) | ORAL | 0 refills | Status: DC

## 2016-05-26 MED ORDER — ONDANSETRON HCL 4 MG/2ML IJ SOLN
4.0000 mg | Freq: Once | INTRAMUSCULAR | Status: AC
  Administered 2016-05-26: 4 mg via INTRAVENOUS
  Filled 2016-05-26: qty 2

## 2016-05-26 MED ORDER — HYDROCODONE-ACETAMINOPHEN 5-325 MG PO TABS: 2.0000 | ORAL_TABLET | ORAL | 0 refills | Status: DC | PRN

## 2016-05-26 MED ORDER — ONDANSETRON 4 MG PO TBDP: 4.0000 mg | ORAL_TABLET | Freq: Three times a day (TID) | ORAL | 0 refills | Status: DC | PRN

## 2016-05-26 NOTE — ED Triage Notes (Signed)
Per pt, states left lower abdominal pain radiating into groin-started 2 days ago-

## 2016-05-26 NOTE — ED Notes (Signed)
Urine collected from patient.  Sent to lab.

## 2016-05-26 NOTE — ED Notes (Signed)
Ultrasound at bedside

## 2016-05-26 NOTE — ED Notes (Signed)
Discharge instructions, follow up care, and rx x3 reviewed with patient. Patient verbalized understanding. 

## 2016-05-26 NOTE — ED Provider Notes (Signed)
[facility]-EMERGENCY DEPT Provider Note   CSN: [id number] Arrival date & time: 05/26/16  4098     History   Chief Complaint Chief Complaint  Patient presents with  . Abdominal Pain    HPI Gabriel Graham. is a 31 y.o. male.  The history is provided by the patient. No language interpreter was used.  Abdominal Pain   This is a new problem. The current episode started 1 to 2 hours ago. The problem occurs constantly. The problem has been gradually worsening. The pain is severe. Associated symptoms include anorexia and vomiting. Nothing aggravates the symptoms. Nothing relieves the symptoms. His past medical history does not include gallstones.  Pt complains of left scrotal testicle pain.  Pt reports severe pain and vomiting.    Past Medical History:  Diagnosis Date  . Allergy    RHINITIS  . Smoker   . 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 Squamous cell carcinoma     Past Surgical History:  Procedure Laterality Date  . HYDROCELE EXCISION         Home Medications    Prior to Admission medications   Medication Sig Start Date End Date Taking? Authorizing Provider  Multiple Vitamins-Minerals (MULTIVITAMIN WITH MINERALS) tablet Take 1 tablet by mouth daily.   Yes Historical Provider, MD    Family History Family History  Problem Relation Age of Onset  . Diabetes Maternal Grandmother   . Emphysema Paternal Grandmother     Social History Social History  Substance Use Topics  . Smoking status: Current Every Day Smoker    Packs/day: 1.00    Types: Cigarettes  . Smokeless tobacco: Never Used  . Alcohol use Yes     Allergies   Sulfa antibiotics   Review of Systems Review of Systems  Gastrointestinal: Positive for abdominal pain, anorexia and vomiting.  All other systems reviewed and are negative.    Physical Exam Updated Vital Signs BP 112/61   Pulse 73   Temp  98.1 F (36.7 C) (Oral)   Resp 16   SpO2 98%   Physical Exam  Constitutional: He appears well-developed and well-nourished.  HENT:  Head: Normocephalic and atraumatic.  Eyes: Conjunctivae are normal.  Neck: Neck supple.  Cardiovascular: Normal rate and regular rhythm.   No murmur heard. Pulmonary/Chest: Effort normal and breath sounds normal. No respiratory distress.  Abdominal: Soft. There is no tenderness.  Genitourinary: Penis normal. No penile tenderness.  Genitourinary Comments: Tender left scrotum, erythema bilat scrotal region.    Musculoskeletal: Normal range of motion. He exhibits no edema.  Neurological: He is alert.  Skin: Skin is warm and dry.  Psychiatric: He has a normal mood and affect.  Nursing note and vitals reviewed.    ED Treatments / Results  Labs (all labs ordered are listed, but only abnormal results are displayed) Labs Reviewed  COMPREHENSIVE METABOLIC PANEL - Abnormal; Notable for the following:       Result Value   ALT 14 (*)    All other components within normal limits  CBC - Abnormal; Notable for the following:    WBC 15.7 (*)    All other components within normal limits  URINALYSIS, ROUTINE W REFLEX MICROSCOPIC - Abnormal; Notable for the following:    APPearance HAZY (*)    Hgb urine dipstick LARGE (*)    Ketones, ur 20 (*)    Bacteria, UA  RARE (*)    All other components within normal limits  LIPASE, BLOOD  GC/CHLAMYDIA PROBE AMP ([place]) NOT AT [facility]    EKG  EKG Interpretation None       Radiology [place] Scrotum  Result Date: 05/26/2016 CLINICAL DATA:  Left testicle pain since this morning, evaluate for torsion. EXAM: SCROTAL ULTRASOUND DOPPLER ULTRASOUND OF THE TESTICLES TECHNIQUE: Complete ultrasound examination of the testicles, epididymis, and other scrotal structures was performed. Color and spectral Doppler ultrasound were also utilized to evaluate blood flow to the testicles. COMPARISON:  10/11/2014 FINDINGS: Right  testicle Measurements: 48 x 23 x 37 mm. No mass or microlithiasis visualized. Left testicle Measurements: 48 x 27 x 28 mm. No mass or microlithiasis visualized. Right epididymis: Normal in size and vascularity. Incidental 5 mm cyst. Left epididymis: Normal in size and vascularity. Two incidental cysts, measuring 3 mm. Hydrocele:  Small volume fluid on the left. Varicocele: Present on the left, with veins dilating up to 3.3 mm during Valsalva. Pulsed Doppler interrogation of both testes demonstrates normal low resistance arterial and venous waveforms bilaterally. IMPRESSION: 1. No acute finding, including signs of torsion. 2. Left varicocele and small hydrocele. Electronically Signed   By: Marnee SpringJonathon  Watts M.D.   On: 05/26/2016 13:12   [place] Art/ven Flow Abd Pelv Doppler  Result Date: 05/26/2016 CLINICAL DATA:  Left testicle pain since this morning, evaluate for torsion. EXAM: SCROTAL ULTRASOUND DOPPLER ULTRASOUND OF THE TESTICLES TECHNIQUE: Complete ultrasound examination of the testicles, epididymis, and other scrotal structures was performed. Color and spectral Doppler ultrasound were also utilized to evaluate blood flow to the testicles. COMPARISON:  10/11/2014 FINDINGS: Right testicle Measurements: 48 x 23 x 37 mm. No mass or microlithiasis visualized. Left testicle Measurements: 48 x 27 x 28 mm. No mass or microlithiasis visualized. Right epididymis: Normal in size and vascularity. Incidental 5 mm cyst. Left epididymis: Normal in size and vascularity. Two incidental cysts, measuring 3 mm. Hydrocele:  Small volume fluid on the left. Varicocele: Present on the left, with veins dilating up to 3.3 mm during Valsalva. Pulsed Doppler interrogation of both testes demonstrates normal low resistance arterial and venous waveforms bilaterally. IMPRESSION: 1. No acute finding, including signs of torsion. 2. Left varicocele and small hydrocele. Electronically Signed   By: Marnee SpringJonathon  Watts M.D.   On: 05/26/2016 13:12     Procedures Procedures (including critical care time)  Medications Ordered in ED Medications  HYDROmorphone (DILAUDID) injection 1 mg (1 mg Intravenous Given 05/26/16 1210)  ondansetron (ZOFRAN) injection 4 mg (4 mg Intravenous Given 05/26/16 1210)  HYDROmorphone (DILAUDID) injection 1 mg (1 mg Intravenous Given 05/26/16 1332)   Bedside ultrasound by Dr. Clydene PughKnott.  Good blood flow.  Formal ultrasound shows hydrocele, no torsion.  Pt had a similar episode a year ago.  Pt reports ultrasound was normal then.   Initial Impression / Assessment and Plan / ED Course  I have reviewed the triage vital signs and the nursing notes.  Pertinent labs & imaging results that were available during my care of the patient were reviewed by me and considered in my medical decision making (see chart for details).    Pt had relief with pain medication.  On repeat exam pt feels better.  Pt eating chick filet.     Final Clinical Impressions(s) / ED Diagnoses   Final diagnoses:  Scrotal pain  Non-intractable vomiting without nausea, unspecified vomiting type    New Prescriptions New Prescriptions   IBUPROFEN (ADVIL,MOTRIN) 800 MG TABLET  Take 1 tablet (800 mg total) by mouth 3 (three) times daily.     Lonia Skinner [place], PA-C 05/26/16 1515    Lyndal Pulley, MD 05/26/16 [phone]

## 2016-05-26 NOTE — ED Notes (Signed)
Patient aware of urine sample. Patient given urinal and encouraged to void when able.

## 2016-05-27 LAB — GC/CHLAMYDIA PROBE AMP (~~LOC~~) NOT AT ARMC
Chlamydia: NEGATIVE
Neisseria Gonorrhea: NEGATIVE

## 2016-05-28 ENCOUNTER — Other Ambulatory Visit: Payer: Self-pay | Admitting: Urology

## 2016-05-29 ENCOUNTER — Encounter (HOSPITAL_COMMUNITY): Payer: Self-pay

## 2016-05-31 NOTE — H&P (Signed)
HPI: Gabriel Graham is a 31 year-old male with a left ureteral stone. Removal of  The problem is on the left side. His pain started about approximately 05/23/2016. The pain is  sharp. The intensity of his pain is rated as a 10. The pain is intermittent. The pain does not  radiate.   None< makes the pain better. Nothing causes the pain to become worse. He was treated with the  following pain medication(s): Vicodin and Ibuprofen. He has not had this same pain previously.   This 31 year old male comes in today for follow-up of her recent emergency room visit yesterday.  He began having a left inguinal/lower quadrants pain 3-4 days ago. This was sudden in onset. At  first, it was very steady, now it is crampy in nature. One or 2 episodes of nausea and vomiting.  No exacerbating or relieving factors. No change in lower urinary tract symptomatology.   He went to the emergency room where he was found to have microscopic hematuria. He did not have  an abdominal CT scan, but did have an ultrasound revealing a small left hydrocele but no other  abnormalities.   He denies prior history of kidney stones. He denies any right-sided pain. He denies any back or  flank pain. He has had no fever or chills. He has not had gross hematuria.     ALLERGIES: Sulfa Drugs    MEDICATIONS: None   GU PSH: Excision of Hydrocele    NON-GU PSH: Repair Tooth Socket    GU PMH: None   NON-GU PMH: None   FAMILY HISTORY: 2 daughters - Daughter Kidney Stones - Runs in Family   SOCIAL HISTORY: Marital Status: Unknown Current Smoking Status: Patient smokes. Has smoked since 05/05/1999.   Tobacco Use Assessment Completed: Used Tobacco in last 30 days? Does not drink anymore.  Drinks 4+ caffeinated drinks per day.    REVIEW OF SYSTEMS:    GU Review Male:   Patient reports leakage of urine. Patient denies frequent urination, hard to  postpone urination, burning/ pain with urination, get up at night to urinate,  stream starts and  stops, trouble starting your stream, have to strain to urinate , erection problems, and penile  pain.  Gastrointestinal (Upper):   Patient reports vomiting. Patient denies nausea and indigestion/  heartburn.  Gastrointestinal (Lower):   Patient reports constipation. Patient denies diarrhea.  Constitutional:   Patient denies fever, night sweats, weight loss, and fatigue.  Skin:   Patient denies skin rash/ lesion and itching.  Eyes:   Patient denies blurred vision and double vision.  Ears/ Nose/ Throat:   Patient reports sinus problems. Patient denies sore throat.  Hematologic/Lymphatic:   Patient denies swollen glands and easy bruising.  Cardiovascular:   Patient denies leg swelling and chest pains.  Respiratory:   Patient reports shortness of breath. Patient denies cough.  Endocrine:   Patient denies excessive thirst.  Musculoskeletal:   Patient denies back pain and joint pain.  Neurological:   Patient denies headaches and dizziness.  Psychologic:   Patient denies depression and anxiety.   VITAL SIGNS:     Weight 130 lb / 58.97 kg  Height 69 in / 175.26 cm  BP 107/71 mmHg  Pulse 67 /min  Temperature 97.4 F / 36 C  BMI 19.2 kg/m   GU PHYSICAL EXAMINATION:    Scrotum: No lesions. No edema. No cysts. No warts.  Epididymides: Right: no spermatocele, no masses, no cysts, no tenderness, no induration, no  enlargement. Left: no spermatocele, no masses, no cysts, no tenderness, no induration, no  enlargement.  Testes: No tenderness, no swelling, no enlargement left testes. No tenderness, no swelling, no  enlargement right testes. Normal location left testes. Normal location right testes. No mass, no  cyst, no varicocele, no hydrocele left testes. No mass, no cyst, no varicocele, no hydrocele  right testes.  Urethral Meatus: Normal size. No lesion, no wart, no discharge, no polyp. Normal location.  Penis: Circumcised, no warts, no cracks. No dorsal Peyronie's plaques,  no left corporal  Peyronie's plaques, no right corporal Peyronie's plaques, no scarring, no warts. No balanitis, no  meatal stenosis.   MULTI-SYSTEM PHYSICAL EXAMINATION:    Constitutional: Well-nourished. No physical deformities. Normally developed. Good grooming.  Neck: Neck symmetrical, not swollen. Normal tracheal position.  Respiratory: No labored breathing, no use of accessory muscles.   Cardiovascular: Normal temperature, normal extremity pulses, no swelling, no varicosities.  Lymphatic: No enlargement of neck, axillae, groin.  Skin: No paleness, no jaundice, no cyanosis. No lesion, no ulcer, no rash.  Neurologic / Psychiatric: Oriented to time, oriented to place, oriented to person. No depression,  no anxiety, no agitation.  Gastrointestinal: No mass, no tenderness, no rigidity, non obese abdomen. Minimal left CVA  tenderness. No inguinal hernia. Well-healed right infrapubic scar.  Eyes: Normal conjunctivae. Normal eyelids.  Ears, Nose, Mouth, and Throat: Left ear no scars, no lesions, no masses. Right ear no scars, no  lesions, no masses. Nose no scars, no lesions, no masses. Normal hearing. Normal lips.  Musculoskeletal: Normal gait and station of head and neck.     PAST DATA REVIEWED:  Source Of History:  Patient  X-Ray Review: [facility] Protocol: Reviewed Films. Discussed With Patient. The patient has an 8 x  6 x 5 mm stone at the left UPJ. Hounsfield units between 611 and 825. Skin the stone distance of  8 cm. There is a tiny left lower pole stone as well. The stone can be seen on scout film.    PROCEDURES:         C.T. Urogram - [id number]               Urinalysis w/Scope Dipstick Dipstick Cont'd Micro  Color: Amber Bilirubin: Neg WBC/hpf: 0 - 5/hpf  Appearance: Cloudy Ketones: Neg RBC/hpf: >60/hpf  Specific Gravity: 1.025 Blood: 3+ Bacteria: Rare (0-9/hpf)  pH: 6.0 Protein: 1+ Cystals: NS (Not Seen)  Glucose: Neg Urobilinogen: 0.2 Casts: NS (Not Seen)    Nitrites:  Neg Trichomonas: Not Present    Leukocyte Esterase: Neg Mucous: Not Present      Epithelial Cells: 0 - 5/hpf      Yeast: NS (Not Seen)      Sperm: Not Present    ASSESSMENT:      ICD-10 Details  1 GU:   Calculus Ureter - N20.1 8 x 6 x 5 mm left UPJ stone. He is minimally symptomatic at the  present time, but with the patient's prior history of pain about a year ago, this stone may be  long-standing and more than likely will not pass any time soon.  2   Abdominal Pain Unspec - R10.9 Secondary to his stone.   PLAN:     1. I carefully spelled out treatment options with the patient-I do not think medical  expulsive therapy will help this man who more than likely has had the stone prolonged time. We  discussed shockwave lithotripsy versus ureteroscopy, laser lithotripsy and extraction of  the  stone with probable stent placement. We discussed stone free rates of about 80% with lithotripsy  with 1 treatment and 90-95% with ureteroscopy. We also discussed the sedation used with  lithotripsy and general anesthesia with ureteroscopy   2. The patient was given a prescription for oxycodone   3. He was told to hold off on anti-inflammatory medication for now in case he has lithotripsy   4.  He has elected to proceed with lithotripsy.

## 2016-06-01 ENCOUNTER — Ambulatory Visit (HOSPITAL_COMMUNITY)
Admission: RE | Admit: 2016-06-01 | Discharge: 2016-06-01 | Disposition: A | Discharge status: Home / Self Care | Payer: Commercial Managed Care - PPO | Source: Ambulatory Visit | Attending: Urology | Admitting: Urology

## 2016-06-01 ENCOUNTER — Encounter (HOSPITAL_COMMUNITY)
Admission: RE | Disposition: A | Discharge status: Home / Self Care | Payer: Self-pay | Source: Ambulatory Visit | Attending: Urology

## 2016-06-01 ENCOUNTER — Ambulatory Visit (HOSPITAL_COMMUNITY): Payer: Commercial Managed Care - PPO

## 2016-06-01 ENCOUNTER — Encounter (HOSPITAL_COMMUNITY): Payer: Self-pay | Admitting: *Deleted

## 2016-06-01 DIAGNOSIS — N202 Calculus of kidney with calculus of ureter: Secondary | ICD-10-CM | POA: Diagnosis present

## 2016-06-01 DIAGNOSIS — N132 Hydronephrosis with renal and ureteral calculous obstruction: Secondary | ICD-10-CM | POA: Insufficient documentation

## 2016-06-01 DIAGNOSIS — N433 Hydrocele, unspecified: Secondary | ICD-10-CM | POA: Insufficient documentation

## 2016-06-01 DIAGNOSIS — N200 Calculus of kidney: Secondary | ICD-10-CM

## 2016-06-01 DIAGNOSIS — R52 Pain, unspecified: Secondary | ICD-10-CM

## 2016-06-01 HISTORY — DX: Calculus of kidney: N20.0

## 2016-06-01 HISTORY — PX: EXTRACORPOREAL SHOCK WAVE LITHOTRIPSY: SHX1557

## 2016-06-01 SURGERY — LITHOTRIPSY, ESWL
Anesthesia: LOCAL | Laterality: Left

## 2016-06-01 MED ORDER — SODIUM CHLORIDE 0.9 % IV SOLN
INTRAVENOUS | Status: DC
  Administered 2016-06-01: 10:00:00 via INTRAVENOUS

## 2016-06-01 MED ORDER — ALFUZOSIN HCL ER 10 MG PO TB24: 10.0000 mg | ORAL_TABLET | Freq: Every day | ORAL | 0 refills | Status: AC

## 2016-06-01 MED ORDER — CIPROFLOXACIN HCL 500 MG PO TABS
500.0000 mg | ORAL_TABLET | ORAL | Status: AC
  Administered 2016-06-01: 500 mg via ORAL
  Filled 2016-06-01 (×2): qty 1

## 2016-06-01 MED ORDER — ACETAMINOPHEN 10 MG/ML IV SOLN
1000.0000 mg | Freq: Once | INTRAVENOUS | Status: AC
  Administered 2016-06-01: 1000 mg via INTRAVENOUS
  Filled 2016-06-01 (×2): qty 100

## 2016-06-01 MED ORDER — OXYCODONE HCL 10 MG PO TABS: 10.0000 mg | ORAL_TABLET | ORAL | 0 refills | Status: DC | PRN

## 2016-06-01 MED ORDER — OXYCODONE HCL 5 MG PO TABS
10.0000 mg | ORAL_TABLET | Freq: Once | ORAL | Status: AC
  Administered 2016-06-01: 10 mg via ORAL
  Filled 2016-06-01: qty 2

## 2016-06-01 MED ORDER — OXYCODONE HCL 5 MG PO TABS
5.0000 mg | ORAL_TABLET | Freq: Once | ORAL | Status: AC
  Administered 2016-06-01: 5 mg via ORAL
  Filled 2016-06-01: qty 1

## 2016-06-01 MED ORDER — DIPHENHYDRAMINE HCL 25 MG PO CAPS
25.0000 mg | ORAL_CAPSULE | ORAL | Status: AC
  Administered 2016-06-01: 25 mg via ORAL
  Filled 2016-06-01: qty 1

## 2016-06-01 MED ORDER — DIAZEPAM 5 MG PO TABS
10.0000 mg | ORAL_TABLET | ORAL | Status: AC
  Administered 2016-06-01: 10 mg via ORAL
  Filled 2016-06-01: qty 2

## 2016-06-01 NOTE — Op Note (Signed)
See [facility] OP note scanned into chart. Also because of the size, density, location and other factors that cannot be anticipated I feel this will likely be a staged procedure. This fact supersedes any indication in the scanned [facility] operative note to the contrary.  

## 2016-06-01 NOTE — Progress Notes (Signed)
Discharged home with mother driving at [phone].

## 2016-06-01 NOTE — Discharge Instructions (Signed)
Lithotripsy, Care After °Refer to this sheet in the next few weeks. These instructions provide you with information on caring for yourself after your procedure. Your health care provider may also give you more specific instructions. Your treatment has been planned according to current medical practices, but problems sometimes occur. Call your health care provider if you have any problems or questions after your procedure. °WHAT TO EXPECT AFTER THE PROCEDURE  °· Your urine may have a red tinge for a few days after treatment. Blood loss is usually minimal. °· You may have soreness in the back or flank area. This usually goes away after a few days. The procedure can cause blotches or bruises on the back where the pressure wave enters the skin. These marks usually cause only minimal discomfort and should disappear in a short time. °· Stone fragments should begin to pass within 24 hours of treatment. However, a delayed passage is not unusual. °· You may have pain, discomfort, and feel sick to your stomach (nauseated) when the crushed fragments of stone are passed down the tube from the kidney to the bladder. Stone fragments can pass soon after the procedure and may last for up to 4-8 weeks. °· A small number of patients may have severe pain when stone fragments are not able to pass, which leads to an obstruction. °· If your stone is greater than 1 inch (2.5 cm) in diameter or if you have multiple stones that have a combined diameter greater than 1 inch (2.5 cm), you may require more than one treatment. °· If you had a stent placed prior to your procedure, you may experience some discomfort, especially during urination. You may experience the pain or discomfort in your flank or back, or you may experience a sharp pain or discomfort at the base of your penis or in your lower abdomen. The discomfort usually lasts only a few minutes after urinating. °HOME CARE INSTRUCTIONS  °· Rest at home until you feel your energy  improving. °· Only take over-the-counter or prescription medicines for pain, discomfort, or fever as directed by your health care provider. Depending on the type of lithotripsy, you may need to take antibiotics and anti-inflammatory medicines for a few days. °· Drink enough water and fluids to keep your urine clear or pale yellow. This helps "flush" your kidneys. It helps pass any remaining pieces of stone and prevents stones from coming back. °· Most people can resume daily activities within 1-2 days after standard lithotripsy. It can take longer to recover from laser and percutaneous lithotripsy. °· Strain all urine through the provided strainer. Keep all particulate matter and stones for your health care provider to see. The stone may be as small as a grain of salt. It is very important to use the strainer each and every time you pass your urine. Any stones that are found can be sent to a medical lab for examination. °· Visit your health care provider for a follow-up appointment in a few weeks. Your doctor may remove your stent if you have one. Your health care provider will also check to see whether stone particles still remain. °SEEK MEDICAL CARE IF:  °· Your pain is not relieved by medicine. °· You have a lasting nauseous feeling. °· You feel there is too much blood in the urine. °· You develop persistent problems with frequent or painful urination that does not at least partially improve after 2 days following the procedure. °· You have a congested cough. °· You feel   lightheaded. °· You develop a rash or any other signs that might suggest an allergic problem. °· You develop any reaction or side effects to your medicine(s). °SEEK IMMEDIATE MEDICAL CARE IF:  °· You experience severe back or flank pain or both. °· You see nothing but blood when you urinate. °· You cannot pass any urine at all. °· You have a fever or shaking chills. °· You develop shortness of breath, difficulty breathing, or chest pain. °· You  develop vomiting that will not stop after 6-8 hours. °· You have a fainting episode. °This information is not intended to replace advice given to you by your health care provider. Make sure you discuss any questions you have with your health care provider. °Document Released: 05/10/2007 Document Revised: 01/09/2015 Document Reviewed: 11/03/2012 °Elsevier Interactive Patient Education © 2017 [employer] ° °Moderate Conscious Sedation, Adult, Care After °These instructions provide you with information about caring for yourself after your procedure. Your health care provider may also give you more specific instructions. Your treatment has been planned according to current medical practices, but problems sometimes occur. Call your health care provider if you have any problems or questions after your procedure. °What can I expect after the procedure? °After your procedure, it is common: °· To feel sleepy for several hours. °· To feel clumsy and have poor balance for several hours. °· To have poor judgment for several hours. °· To vomit if you eat too soon. °Follow these instructions at home: °For at least 24 hours after the procedure:  °· Do not: °¨ Participate in activities where you could fall or become injured. °¨ Drive. °¨ Use heavy machinery. °¨ Drink alcohol. °¨ Take sleeping pills or medicines that cause drowsiness. °¨ Make important decisions or sign legal documents. °¨ Take care of children on your own. °· Rest. °Eating and drinking °· Follow the diet recommended by your health care provider. °· If you vomit: °¨ Drink water, juice, or soup when you can drink without vomiting. °¨ Make sure you have little or no nausea before eating solid foods. °General instructions °· Have a responsible adult stay with you until you are awake and alert. °· Take over-the-counter and prescription medicines only as told by your health care provider. °· If you smoke, do not smoke without supervision. °· Keep all follow-up visits  as told by your health care provider. This is important. °Contact a health care provider if: °· You keep feeling nauseous or you keep vomiting. °· You feel light-headed. °· You develop a rash. °· You have a fever. °Get help right away if: °· You have trouble breathing. °This information is not intended to replace advice given to you by your health care provider. Make sure you discuss any questions you have with your health care provider. °Document Released: 02/08/2013 Document Revised: 09/23/2015 Document Reviewed: 08/10/2015 °Elsevier Interactive Patient Education © 2017 [employer] ° ° °

## 2016-06-01 NOTE — Progress Notes (Signed)
Pain 9/10 post litho, no relief with pain meds.  IV Tylenol administered and Pt taken for renal US to r/o hematoma.  Results called to Dr Vernie Ammonsttelin who is ok with discharge. Pt returned with pain 5/10 which increases with activity. Pt unable to eat snack but has sipped fluids intermittently.  Pt's girlfriend [place]Brittany (took perscription and discharge instructions) had to leave, his mother Olegario MessierKathy states she is currently in route to pick him up for discharge.

## 2016-07-28 ENCOUNTER — Encounter (HOSPITAL_COMMUNITY): Payer: Self-pay | Admitting: Urology

## 2017-09-11 ENCOUNTER — Emergency Department (HOSPITAL_COMMUNITY)
Admission: EM | Admit: 2017-09-11 | Discharge: 2017-09-12 | Disposition: A | Discharge status: Home / Self Care | Payer: Commercial Managed Care - PPO | Attending: Emergency Medicine | Admitting: Emergency Medicine

## 2017-09-11 ENCOUNTER — Other Ambulatory Visit: Payer: Self-pay

## 2017-09-11 ENCOUNTER — Encounter (HOSPITAL_COMMUNITY): Payer: Self-pay | Admitting: *Deleted

## 2017-09-11 DIAGNOSIS — S61211A Laceration without foreign body of left index finger without damage to nail, initial encounter: Secondary | ICD-10-CM | POA: Insufficient documentation

## 2017-09-11 DIAGNOSIS — Z85828 Personal history of other malignant neoplasm of skin: Secondary | ICD-10-CM | POA: Insufficient documentation

## 2017-09-11 DIAGNOSIS — Z79899 Other long term (current) drug therapy: Secondary | ICD-10-CM | POA: Diagnosis not present

## 2017-09-11 DIAGNOSIS — Y92000 Kitchen of unspecified non-institutional (private) residence as  the place of occurrence of the external cause: Secondary | ICD-10-CM | POA: Diagnosis not present

## 2017-09-11 DIAGNOSIS — Z87891 Personal history of nicotine dependence: Secondary | ICD-10-CM | POA: Insufficient documentation

## 2017-09-11 DIAGNOSIS — Y999 Unspecified external cause status: Secondary | ICD-10-CM | POA: Diagnosis not present

## 2017-09-11 DIAGNOSIS — Y93G1 Activity, food preparation and clean up: Secondary | ICD-10-CM | POA: Insufficient documentation

## 2017-09-11 DIAGNOSIS — W260XXA Contact with knife, initial encounter: Secondary | ICD-10-CM | POA: Insufficient documentation

## 2017-09-11 DIAGNOSIS — Z23 Encounter for immunization: Secondary | ICD-10-CM | POA: Diagnosis not present

## 2017-09-11 MED ORDER — TETANUS-DIPHTH-ACELL PERTUSSIS 5-2.5-18.5 LF-MCG/0.5 IM SUSP
0.5000 mL | Freq: Once | INTRAMUSCULAR | Status: AC
  Administered 2017-09-11: 0.5 mL via INTRAMUSCULAR
  Filled 2017-09-11: qty 0.5

## 2017-09-11 NOTE — Discharge Instructions (Addendum)
Return if any problems.

## 2017-09-11 NOTE — ED Triage Notes (Signed)
Pt was washing dishes and cut left fore finger on knife; finger is wrapped pt states it happened x 1 hour ago

## 2017-09-12 NOTE — ED Provider Notes (Addendum)
[facility] EMERGENCY DEPARTMENT Provider Note   CSN: [id number] Arrival date & time: 09/11/17  2321     History   Chief Complaint Chief Complaint  Patient presents with  . Laceration    HPI Gabriel Graham is a 32 y.o. male.  The history is provided by the patient. No language interpreter was used.  Laceration   The incident occurred 1 to 2 hours ago. The laceration is located on the left hand. The laceration is 1 cm in size. The laceration mechanism was a a dirty knife. The pain is mild. The pain has been constant since onset. He reports no foreign bodies present. His tetanus status is out of date.    Past Medical History:  Diagnosis Date  . Allergy    RHINITIS  . Kidney stone    05/29/16   . Smoker     Patient Active Problem List   Diagnosis Date Noted  . Squamous cell carcinoma     Past Surgical History:  Procedure Laterality Date  . EXTRACORPOREAL SHOCK WAVE LITHOTRIPSY Left 06/01/2016   Procedure: EXTRACORPOREAL SHOCK WAVE LITHOTRIPSY (ESWL);  Surgeon: Ihor Gully, MD;  Location: [facility];  Service: Urology;  Laterality: Left;  [id number] [phone]  . HYDROCELE EXCISION          Home Medications    Prior to Admission medications   Medication Sig Start Date End Date Taking? Authorizing Provider  fluticasone (FLONASE) 50 MCG/ACT nasal spray Place 1-2 sprays into both nostrils daily as needed for allergies or rhinitis.    [provider]  HYDROcodone-acetaminophen (NORCO/VICODIN) 5-325 MG tablet Take 2 tablets by mouth every 4 (four) hours as needed. Patient taking differently: Take 2 tablets by mouth every 4 (four) hours as needed for moderate pain.  05/26/16   Elson Areas, PA-C  ibuprofen (ADVIL,MOTRIN) 800 MG tablet Take 1 tablet (800 mg total) by mouth 3 (three) times daily. 05/26/16   Elson Areas, PA-C  Multiple Vitamins-Minerals (MULTIVITAMIN WITH MINERALS) tablet Take 1 tablet by mouth daily.    [provider]  ondansetron  (ZOFRAN ODT) 4 MG disintegrating tablet Take 1 tablet (4 mg total) by mouth every 8 (eight) hours as needed for nausea or vomiting. 05/26/16   Elson Areas, PA-C  Oxycodone HCl 10 MG TABS Take 1 tablet (10 mg total) by mouth every 4 (four) hours as needed. 06/01/16   Ihor Gully, MD    Family History Family History  Problem Relation Age of Onset  . Diabetes Maternal Grandmother   . Emphysema Paternal Grandmother     Social History Social History   Tobacco Use  . Smoking status: Former Smoker    Packs/day: 1.00    Types: Cigarettes  . Smokeless tobacco: Never Used  . Tobacco comment: pt vapes  Substance Use Topics  . 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 Drug use: No     Allergies   Sulfa antibiotics   Review of Systems Review of Systems  All other systems reviewed and are negative.    Physical Exam Updated Vital Signs BP 118/80 (BP Location: Right Arm)   Pulse 91   Temp 99 F (37.2 C) (Oral)   Resp 18   Ht  (1.753 m)   Wt 58.1 kg (128 lb)   SpO2 98%   BMI 18.90 kg/m   Physical Exam  Constitutional: He appears well-developed and well-nourished.  HENT:  Head: Normocephalic.  Musculoskeletal: He exhibits tenderness.  1cm laceration left index finger,  Superficial  nv and ns intact   Neurological: He is alert.  Skin: Skin is dry.  Psychiatric: He has a normal mood and affect.  Nursing note and vitals reviewed.    ED Treatments / Results  Labs (all labs ordered are listed, but only abnormal results are displayed) Labs Reviewed - No data to display  EKG None  Radiology No results found.  Procedures .Marland KitchenLaceration Repair Date/Time: 09/12/2017 7:16 PM Performed by: Elson Areas, PA-C Authorized by: Elson Areas, PA-C   Consent:    Consent obtained:  Verbal   Consent given by:  Patient Anesthesia (see MAR for exact dosages):    Anesthesia method:  None Laceration details:    Location:  Finger   Length (cm):   1 Repair type:    Repair type:  Simple Pre-procedure details:    Preparation:  Patient was prepped and draped in usual sterile fashion Treatment:    Area cleansed with:  Betadine   Irrigation solution:  Sterile saline Skin repair:    Repair method:  Tissue adhesive Approximation:    Approximation:  Loose Post-procedure details:    Patient tolerance of procedure:  Tolerated well, no immediate complications   (including critical care time)  Medications Ordered in ED Medications  Tdap (BOOSTRIX) injection 0.5 mL (0.5 mLs Intramuscular Given 09/11/17 2359)     Initial Impression / Assessment and Plan / ED Course  I have reviewed the triage vital signs and the nursing notes.  Pertinent labs & imaging results that were available during my care of the patient were reviewed by me and considered in my medical decision making (see chart for details).       Final Clinical Impressions(s) / ED Diagnoses   Final diagnoses:  Laceration of left index finger without foreign body without damage to nail, initial encounter    ED Discharge Orders    None      An After Visit Summary was printed and given to the patient. Elson Areas, PA-C 09/12/17 1914    Elson Areas, PA-C 09/12/17 1918    Mancel Bale, MD 09/13/17 [phone]

## 2017-12-05 ENCOUNTER — Emergency Department (HOSPITAL_COMMUNITY): Payer: Commercial Managed Care - PPO

## 2017-12-05 ENCOUNTER — Other Ambulatory Visit: Payer: Self-pay

## 2017-12-05 ENCOUNTER — Emergency Department (HOSPITAL_COMMUNITY)
Admission: EM | Admit: 2017-12-05 | Discharge: 2017-12-05 | Disposition: A | Discharge status: Home / Self Care | Payer: Commercial Managed Care - PPO | Attending: Emergency Medicine | Admitting: Emergency Medicine

## 2017-12-05 ENCOUNTER — Encounter (HOSPITAL_COMMUNITY): Payer: Self-pay | Admitting: Emergency Medicine

## 2017-12-05 DIAGNOSIS — Z79899 Other long term (current) drug therapy: Secondary | ICD-10-CM | POA: Insufficient documentation

## 2017-12-05 DIAGNOSIS — J9383 Other pneumothorax: Secondary | ICD-10-CM

## 2017-12-05 DIAGNOSIS — Z87891 Personal history of nicotine dependence: Secondary | ICD-10-CM | POA: Diagnosis not present

## 2017-12-05 DIAGNOSIS — R079 Chest pain, unspecified: Secondary | ICD-10-CM

## 2017-12-05 DIAGNOSIS — R071 Chest pain on breathing: Secondary | ICD-10-CM | POA: Diagnosis present

## 2017-12-05 LAB — BASIC METABOLIC PANEL
Anion gap: 6 (ref 5–15)
BUN: 15 mg/dL (ref 6–20)
CO2: 30 mmol/L (ref 22–32)
Calcium: 9.2 mg/dL (ref 8.9–10.3)
Chloride: 102 mmol/L (ref 98–111)
Creatinine, Ser: 0.9 mg/dL (ref 0.61–1.24)
GFR calc Af Amer: >60 mL/min (ref >60)
GFR calc non Af Amer: >60 mL/min (ref >60)
Glucose, Bld: 136 mg/dL — ABNORMAL HIGH (ref 70–99)
Potassium: 4 mmol/L (ref 3.5–5.1)
Sodium: 138 mmol/L (ref 135–145)

## 2017-12-05 LAB — CBC
HCT: 45.8 % (ref 39.0–52.0)
Hemoglobin: 15.3 g/dL (ref 13.0–17.0)
MCH: 30.1 pg (ref 26.0–34.0)
MCHC: 33.4 g/dL (ref 30.0–36.0)
MCV: 90 fL (ref 78.0–100.0)
Platelets: 159 10*3/uL (ref 150–400)
RBC: 5.09 MIL/uL (ref 4.22–5.81)
RDW: 13 % (ref 11.5–15.5)
WBC: 6.7 10*3/uL (ref 4.0–10.5)

## 2017-12-05 LAB — TROPONIN I: Troponin I: <0.03 ng/mL (ref <0.03)

## 2017-12-05 MED ORDER — HYDROCODONE-ACETAMINOPHEN 5-325 MG PO TABS: ORAL_TABLET | ORAL | 0 refills | Status: DC

## 2017-12-05 NOTE — ED Triage Notes (Addendum)
Patient c/o left side chest pain intermittent x2 months but is progressively getting worse. Per patient radiates into back/shoulder. Patient states shortness of breath, back pain, and fatigue. Denies any cardiac hx. Patient reports pain is worse with laying, especially on left side. Patient states "I can hear a pop every time my heart beats."

## 2017-12-05 NOTE — ED Notes (Signed)
From rad 

## 2017-12-05 NOTE — ED Notes (Signed)
Pt/mother report CP for the last 2 months  Per mother grandfather died of heart dx at the early ago of mid 4840s Father stented at mid 5450s  Pt has seen no physician in the last several months for his complaint

## 2017-12-05 NOTE — ED Provider Notes (Signed)
[facility] EMERGENCY DEPARTMENT Provider Note   CSN: [id number] Arrival date & time: 12/05/17  1212     History   Chief Complaint Chief Complaint  Patient presents with  . Chest Pain    HPI Gabriel Graham is a 32 y.o. male.  HPI   Gabriel Graham is a 32 y.o. male who presents to the Emergency Department complaining of persistent left sided chest pain for 2 months.  He describes the pain as sharp and associated with deep breathing and with lying supine and on his left side.  Pain intermittently radiates to his left shoulder blade area.  His symptoms are associated with some shortness of breath and fatigue on exertion.  Last evening, he states that he could feel and hear a "popping sensation" in his left chest with each breath and heartbeat.  He denies known injury, new medications, or history of heart disease.  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   Past Medical History:  Diagnosis Date  . Allergy    RHINITIS  . Kidney stone    05/29/16   . Smoker     Patient Active Problem List   Diagnosis Date Noted  . Squamous cell carcinoma     Past Surgical History:  Procedure Laterality Date  . EXTRACORPOREAL SHOCK WAVE LITHOTRIPSY Left 06/01/2016   Procedure: EXTRACORPOREAL SHOCK WAVE LITHOTRIPSY (ESWL);  Surgeon: Ihor Gully, MD;  Location: [facility];  Service: Urology;  Laterality: Left;  [id number] [phone]  . HYDROCELE EXCISION        Home Medications    Prior to Admission medications   Medication Sig Start Date End Date Taking? Authorizing Provider  fluticasone (FLONASE) 50 MCG/ACT nasal spray Place 1-2 sprays into both nostrils daily as needed for allergies or rhinitis.    [provider]  HYDROcodone-acetaminophen (NORCO/VICODIN) 5-325 MG tablet Take 2 tablets by mouth every 4 (four) hours as needed. Patient taking differently: Take 2 tablets by mouth every 4 (four) hours as needed for moderate pain.   05/26/16   Elson Areas, PA-C  ibuprofen (ADVIL,MOTRIN) 800 MG tablet Take 1 tablet (800 mg total) by mouth 3 (three) times daily. 05/26/16   Elson Areas, PA-C  Multiple Vitamins-Minerals (MULTIVITAMIN WITH MINERALS) tablet Take 1 tablet by mouth daily.    [provider]  ondansetron (ZOFRAN ODT) 4 MG disintegrating tablet Take 1 tablet (4 mg total) by mouth every 8 (eight) hours as needed for nausea or vomiting. 05/26/16   Elson Areas, PA-C  Oxycodone HCl 10 MG TABS Take 1 tablet (10 mg total) by mouth every 4 (four) hours as needed. 06/01/16   Ihor Gully, MD    Family History Family History  Problem Relation Age of Onset  . Diabetes Maternal Grandmother   . Emphysema Paternal Grandmother     Social History Social History   Tobacco Use  . Smoking status: Former Smoker    Packs/day: 1.00    Types: Cigarettes  . Smokeless tobacco: Never Used  . Tobacco comment: pt vapes  Substance Use Topics  . 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 Drug use: No     Allergies   Sulfa antibiotics   Review of Systems Review of Systems  Constitutional: Negative for activity change, appetite change, diaphoresis and fever.  HENT: Negative for congestion.   Respiratory: Positive for shortness of breath. Negative for cough.  Cardiovascular: Positive for chest pain. Negative for palpitations and leg swelling.  Gastrointestinal: Negative for abdominal pain, nausea and vomiting.  Genitourinary: Negative for dysuria and flank pain.  Musculoskeletal: Negative for neck pain and neck stiffness.       Pain at left shoulder blade area  Neurological: Negative for dizziness, weakness and numbness.     Physical Exam Updated Vital Signs BP 113/64 (BP Location: Right Arm)   Pulse 93   Temp 97.9 F (36.6 C) (Oral)   Resp 15   Ht 5\' 8"  (1.727 m)   Wt 59 kg (130 lb)   SpO2 98%   BMI 19.77 kg/m   Physical Exam  Constitutional: He appears  well-nourished. No distress.  HENT:  Head: Atraumatic.  Mouth/Throat: Oropharynx is clear and moist.  Neck: Normal range of motion. Neck supple. No JVD present.  Cardiovascular: Normal rate, regular rhythm and intact distal pulses.  No murmur heard. Pulmonary/Chest: Effort normal and breath sounds normal. No respiratory distress. He exhibits tenderness (Mild tenderness to palpation of the left upper anterior chest wall.  No crepitus or bony deformity.).  Abdominal: Soft. He exhibits no distension. There is no tenderness. There is no guarding.  Musculoskeletal: Normal range of motion.  Neurological: He is alert. No sensory deficit.  Skin: Skin is warm. Capillary refill takes less than 2 seconds. No rash noted.  Psychiatric: He has a normal mood and affect.  Nursing note and vitals reviewed.    ED Treatments / Results  Labs (all labs ordered are listed, but only abnormal results are displayed) Labs Reviewed  BASIC METABOLIC PANEL - Abnormal; Notable for the following components:      Result Value   Glucose, Bld 136 (*)    All other components within normal limits  CBC  TROPONIN I    EKG EKG Interpretation  Date/Time:  Sunday December 05 2017 12:18:01 EDT Ventricular Rate:  104 PR Interval:  140 QRS Duration: 78 QT Interval:  308 QTC Calculation: 405 R Axis:   57 Text Interpretation:  Sinus tachycardia Biatrial enlargement When compared with ECG of 02/02/2011 Rate faster Confirmed by Samuel Jester [phone]) on 12/05/2017 2:34:59 PM   Radiology Dg Chest 2 View  Result Date: 12/05/2017 CLINICAL DATA:  Chest pain and shortness of breath for several days. EXAM: CHEST - 2 VIEW COMPARISON:  None. FINDINGS: The heart size and mediastinal contours are within normal limits. A small approximately 10-15% left apical pneumothorax is seen. No evidence of pleural effusion. Both lungs are clear. The visualized skeletal structures are unremarkable. IMPRESSION: Small approximately 10-15% left  apical pneumothorax. Electronically Signed   By: Myles Rosenthal M.D.   On: 12/05/2017 13:07   Dg Ribs Unilateral Left  Result Date: 12/05/2017 CLINICAL DATA:  Worsening left-sided chest pain for 2 months. EXAM: LEFT RIBS - 2 VIEW COMPARISON:  Chest x-ray also obtained today. FINDINGS: No fracture or other bone lesions are seen involving the ribs. A small left apical pneumothorax is again noted. IMPRESSION: No evidence of left rib fracture. Small left apical pneumothorax again seen. Electronically Signed   By: Myles Rosenthal M.D.   On: 12/05/2017 13:55    Procedures Procedures (including critical care time)  Medications Ordered in ED Medications - No data to display   Initial Impression / Assessment and Plan / ED Course  I have reviewed the triage vital signs and the nursing notes.  Pertinent labs & imaging results that were available during my care of the patient were reviewed by  me and considered in my medical decision making (see chart for details).     pt with 2 month hx of chest pain, vitals remain stable, no tachycardia, tachypnea or hypoxia.  CXR reveals small left apical pneumothorax.  Labs reassuring.  No hx of trauma.  Pt is well appearing, comfortable, ambulatory w/o distress.     1435  Consulted Dr. Lovell SheehanJenkins and discussed findings.  He recommends to have pt return here tomorrow for repeat CXR.  I have discussed tx plan with pt and he verbalized understanding and agrees to plan.  Strict return precautions were also discussed.  Final Clinical Impressions(s) / ED Diagnoses   Final diagnoses:  Other pneumothorax    ED Discharge Orders    None       Pauline Ausriplett, Macklyn Glandon, PA-C 12/05/17 1643    Samuel JesterMcManus, Kathleen, DO 12/07/17 1603

## 2017-12-05 NOTE — Discharge Instructions (Addendum)
As discussed, return here tomorrow morning for a repeat chest x-ray.  Return sooner for any sudden, increased pain or shortness of breath

## 2017-12-05 NOTE — ED Notes (Signed)
Lab in to draw

## 2017-12-06 ENCOUNTER — Other Ambulatory Visit: Payer: Self-pay

## 2017-12-06 ENCOUNTER — Emergency Department (HOSPITAL_COMMUNITY): Payer: Commercial Managed Care - PPO

## 2017-12-06 ENCOUNTER — Emergency Department (HOSPITAL_COMMUNITY)
Admission: EM | Admit: 2017-12-06 | Discharge: 2017-12-06 | Disposition: A | Discharge status: Home / Self Care | Payer: Commercial Managed Care - PPO | Attending: Emergency Medicine | Admitting: Emergency Medicine

## 2017-12-06 ENCOUNTER — Encounter (HOSPITAL_COMMUNITY): Payer: Self-pay | Admitting: Emergency Medicine

## 2017-12-06 DIAGNOSIS — R079 Chest pain, unspecified: Secondary | ICD-10-CM | POA: Diagnosis present

## 2017-12-06 DIAGNOSIS — Z87891 Personal history of nicotine dependence: Secondary | ICD-10-CM | POA: Insufficient documentation

## 2017-12-06 DIAGNOSIS — J9383 Other pneumothorax: Secondary | ICD-10-CM | POA: Diagnosis not present

## 2017-12-06 DIAGNOSIS — J939 Pneumothorax, unspecified: Secondary | ICD-10-CM

## 2017-12-06 NOTE — ED Triage Notes (Signed)
Pt here yesterday for cp/sob. Pt has pneumothorax and was told to come back today. Pt states same sx's today but sx's not any worse. Nad in triage.

## 2017-12-06 NOTE — Discharge Instructions (Addendum)
This small pneumothorax may spontaneously resolve.  If you are still having symptoms in 1 week you may contact the clinic listed to arrange for follow-up care or contact the general surgeon, Dr. Lovell SheehanJenkins, to make a follow-up appointment.  As discussed, return to the emergency room for any worsening symptoms such as sudden and severe shortness of breath or increasing pain.

## 2017-12-08 NOTE — ED Provider Notes (Signed)
[facility] EMERGENCY DEPARTMENT Provider Note   CSN: [id number] Arrival date & time: 12/06/17  1029     History   Chief Complaint Chief Complaint  Patient presents with  . Chest Pain  . Shortness of Breath    HPI Gabriel Graham is a 32 y.o. male.  HPI  Gabriel Graham is a 32 y.o. male who presents to the Emergency Department for recheck of chest pain.  Patient was seen here one day prior to this visit and found to have a small pneumothorax.  He was seen by me on previous visit.  He states the pain is unchanged from previous, but he denies worsening.  No shortness of breath.      Past Medical History:  Diagnosis Date  . Allergy    RHINITIS  . Kidney stone    05/29/16   . Smoker     Patient Active Problem List   Diagnosis Date Noted  . Squamous cell carcinoma     Past Surgical History:  Procedure Laterality Date  . EXTRACORPOREAL SHOCK WAVE LITHOTRIPSY Left 06/01/2016   Procedure: EXTRACORPOREAL SHOCK WAVE LITHOTRIPSY (ESWL);  Surgeon: Ihor GullyMark Ottelin, MD;  Location: [facility];  Service: Urology;  Laterality: Left;  [id number] [id number] [phone]  . HYDROCELE EXCISION          Home Medications    Prior to Admission medications   Medication Sig Start Date End Date Taking? Authorizing Provider  HYDROcodone-acetaminophen (NORCO/VICODIN) 5-325 MG tablet Take one tab po q 4 hrs prn pain 12/05/17   Tasmin Exantus, PA-C    Family History Family History  Problem Relation Age of Onset  . Diabetes Maternal Grandmother   . Emphysema Paternal Grandmother     Social History Social History   Tobacco Use  . Smoking status: Former Smoker    Packs/day: 1.00    Types: Cigarettes  . Smokeless tobacco: Never Used  . Tobacco comment: pt vapes  Substance Use Topics  . 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 Drug use: No     Allergies   Sulfa antibiotics   Review of Systems Review of Systems  Constitutional: Negative for appetite change,  chills and fever.  Respiratory: Negative for chest tightness and shortness of breath.   Cardiovascular: Positive for chest pain (left chest pain).  Gastrointestinal: Negative for abdominal pain, nausea and vomiting.  Genitourinary: Negative for flank pain.  Musculoskeletal: Negative for arthralgias, back pain and neck pain.  Skin: Negative for rash.  Neurological: Negative for dizziness, weakness, numbness and headaches.     Physical Exam Updated Vital Signs BP 106/65 (BP Location: Right Arm)   Pulse 74   Temp 97.8 F (36.6 C) (Oral)   Resp 16   SpO2 100%   Physical Exam  Constitutional: He appears well-developed. No distress.  HENT:  Head: Atraumatic.  Mouth/Throat: Oropharynx is clear and moist.  Neck: Normal range of motion. No JVD present.  Cardiovascular: Normal rate, regular rhythm and intact distal pulses.  Pulmonary/Chest: Effort normal. No respiratory distress. He exhibits tenderness (mild ttp of the upper left chest wall).  Musculoskeletal: Normal range of motion.  Neurological: He is alert. No sensory deficit.  Skin: Skin is warm. Capillary refill takes less than 2 seconds. No rash noted.  Psychiatric: He has a normal mood and affect.     ED Treatments / Results  Labs (all labs ordered are listed, but only abnormal results are displayed) Labs Reviewed - No data to  display  EKG None  Radiology Dg Chest 2 View  Result Date: 12/06/2017 CLINICAL DATA:  Follow-up left-sided pneumothorax. EXAM: CHEST - 2 VIEW COMPARISON:  Left rib series of December 05, 2017 and chest x-ray of the same day. FINDINGS: There is a persistent 10-15% left apical pneumothorax not significantly changed since the previous study. There is no significant subcutaneous emphysema. There is no mediastinal shift. There is no pleural effusion. The right lung is clear. Both lungs are mildly hyperinflated the heart and pulmonary vascularity are normal. No acute left rib fracture is observed. IMPRESSION:  Stable 10-15% left apical pneumothorax.  No pleural effusion. Underlying reactive airway disease or chronic bronchitis. No pneumonia nor CHF. The observed bony thorax is unremarkable. Electronically Signed   By: David  [place] M.D.   On: 12/06/2017 11:07   Dg Chest 2 View  Result Date: 12/05/2017 CLINICAL DATA:  Chest pain and shortness of breath for several days. EXAM: CHEST - 2 VIEW COMPARISON:  None. FINDINGS: The heart size and mediastinal contours are within normal limits. A small approximately 10-15% left apical pneumothorax is seen. No evidence of pleural effusion. Both lungs are clear. The visualized skeletal structures are unremarkable. IMPRESSION: Small approximately 10-15% left apical pneumothorax. Electronically Signed   By: Myles Rosenthal M.D.   On: 12/05/2017 13:07   Dg Ribs Unilateral Left  Result Date: 12/05/2017 CLINICAL DATA:  Worsening left-sided chest pain for 2 months. EXAM: LEFT RIBS - 2 VIEW COMPARISON:  Chest x-ray also obtained today. FINDINGS: No fracture or other bone lesions are seen involving the ribs. A small left apical pneumothorax is again noted. IMPRESSION: No evidence of left rib fracture. Small left apical pneumothorax again seen. Electronically Signed   By: Myles Rosenthal M.D.   On: 12/05/2017 13:55     Procedures Procedures (including critical care time)  Medications Ordered in ED Medications - No data to display   Initial Impression / Assessment and Plan / ED Course  I have reviewed the triage vital signs and the nursing notes.  Pertinent labs & imaging results that were available during my care of the patient were reviewed by me and considered in my medical decision making (see chart for details).     Pt well appearing.  Vitals reassuring. Repeat CXR on this visit at recommendation of Dr. Lovell Sheehan with whom I spoke to on pt's previous visit.  CXR remains unchanged and pt denies any worsening sx's.   Has pain medication, agrees to f/u with surgeon in one week  if not improving.    Final Clinical Impressions(s) / ED Diagnoses   Final diagnoses:  Pneumothorax on left    ED Discharge Orders    None       Rosey Bath 12/08/17 2203    Blane Ohara, MD 12/12/17 [phone]

## 2017-12-21 ENCOUNTER — Ambulatory Visit (INDEPENDENT_AMBULATORY_CARE_PROVIDER_SITE_OTHER): Payer: Commercial Managed Care - PPO | Admitting: General Surgery

## 2017-12-21 ENCOUNTER — Ambulatory Visit (HOSPITAL_COMMUNITY)
Admission: RE | Admit: 2017-12-21 | Discharge: 2017-12-21 | Disposition: A | Discharge status: Home / Self Care | Payer: Commercial Managed Care - PPO | Source: Ambulatory Visit | Attending: General Surgery | Admitting: General Surgery

## 2017-12-21 ENCOUNTER — Encounter: Payer: Self-pay | Admitting: General Surgery

## 2017-12-21 VITALS — BP 113/72 | HR 77 | Temp 97.8°F | Resp 16 | Wt 126.0 lb

## 2017-12-21 DIAGNOSIS — J939 Pneumothorax, unspecified: Secondary | ICD-10-CM | POA: Diagnosis present

## 2017-12-21 NOTE — Patient Instructions (Signed)

## 2017-12-21 NOTE — Progress Notes (Signed)
Gabriel Graham; Gabriel Graham   HPI Patient is a 32 year old white male who was referred to my care by the emergency room for follow-up of a left spontaneous pneumothorax.  He was initially seen in the emergency room on 12/05/2017 for an acute onset of left-sided chest pain and was found to have a 10 to 15% left pneumothorax.  He returned the following day and a follow-up chest x-ray revealed no change.  Since that time, he states that his left sided chest pain has resolved.  He denies any shortness of breath.  He has 0 out of 10 chest pain.  He does state that he has had a previous episode of this in the remote past.  He has quit smoking but does vape. Past Medical History:  Diagnosis Date  . Allergy    RHINITIS  . Kidney stone    05/29/16   . Smoker     Past Surgical History:  Procedure Laterality Date  . EXTRACORPOREAL SHOCK WAVE LITHOTRIPSY Left 06/01/2016   Procedure: EXTRACORPOREAL SHOCK WAVE LITHOTRIPSY (ESWL);  Surgeon: Ihor GullyMark Ottelin, MD;  Location: [facility];  Service: Urology;  Laterality: Left;  [id number] [id number] [phone]  . HYDROCELE EXCISION      Family History  Problem Relation Age of Onset  . Diabetes Maternal Grandmother   . Emphysema Paternal Grandmother     Current Outpatient Medications on File Prior to Visit  Medication Sig Dispense Refill  . HYDROcodone-acetaminophen (NORCO/VICODIN) 5-325 MG tablet Take one tab po q 4 hrs prn pain (Patient not taking: Reported on 12/21/2017) 15 tablet 0   No current facility-administered medications on file prior to visit.     Allergies  Allergen Reactions  . 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 Packs/day: 1.00  . Types: Cigarettes  Smokeless Tobacco Never Used  Tobacco Comment   pt vapes    Review of Systems  Constitutional:  Positive for malaise/fatigue.  HENT: Negative.   Eyes: Negative.   Respiratory: Negative.   Cardiovascular: Negative.   Gastrointestinal: Negative.   Genitourinary: Negative.   Musculoskeletal: Negative.   Skin: Negative.   Neurological: Negative.   Endo/Heme/Allergies: Negative.   Psychiatric/Behavioral: Negative.     Objective   Vitals:   12/21/17 1131  BP: 113/72  Pulse: 77  Resp: 16  Temp: 97.8 F (36.6 C)    Physical Exam  Constitutional: He is oriented to person, place, and time. He appears well-developed and well-nourished. No distress.  HENT:  Head: Normocephalic and atraumatic.  Cardiovascular: Normal rate, regular rhythm and normal heart sounds. Exam reveals no gallop and no friction rub.  No murmur heard. Pulmonary/Chest: Effort normal and breath sounds normal. No stridor. No respiratory distress. He has no wheezes. He has no rales. He exhibits no tenderness.  Neurological: He is alert and oriented to person, place, and time.  Skin: Skin is warm and dry.  Vitals reviewed.  Chest x-ray images personally reviewed Assessment  History of left spontaneous pneumothorax, clinically resolved Plan   We will get follow-up chest x-ray today  I did tell the patient that should he have recurrent episodes of a collapsed lung, he will need to follow-up with either a pulmonologist or cardiothoracic surgeon concerning possible intrinsic disease of the lung.

## 2019-02-23 ENCOUNTER — Other Ambulatory Visit: Payer: Self-pay

## 2019-02-23 DIAGNOSIS — Z20822 Contact with and (suspected) exposure to covid-19: Secondary | ICD-10-CM

## 2019-02-23 LAB — NOVEL CORONAVIRUS, NAA: SARS-CoV-2, NAA: NOT DETECTED

## 2019-02-26 LAB — NOVEL CORONAVIRUS, NAA: SARS-CoV-2, NAA: NOT DETECTED

## 2020-01-12 IMAGING — DX DG RIBS 2V*L*
3 series · 3 of 3 positions shown · non-contrast
Comparison: Chest x-ray also obtained today.

CLINICAL DATA: Worsening left-sided chest pain for 2 months.

EXAM:
LEFT RIBS - 2 VIEW

[rib pa obl (1 of 2)]
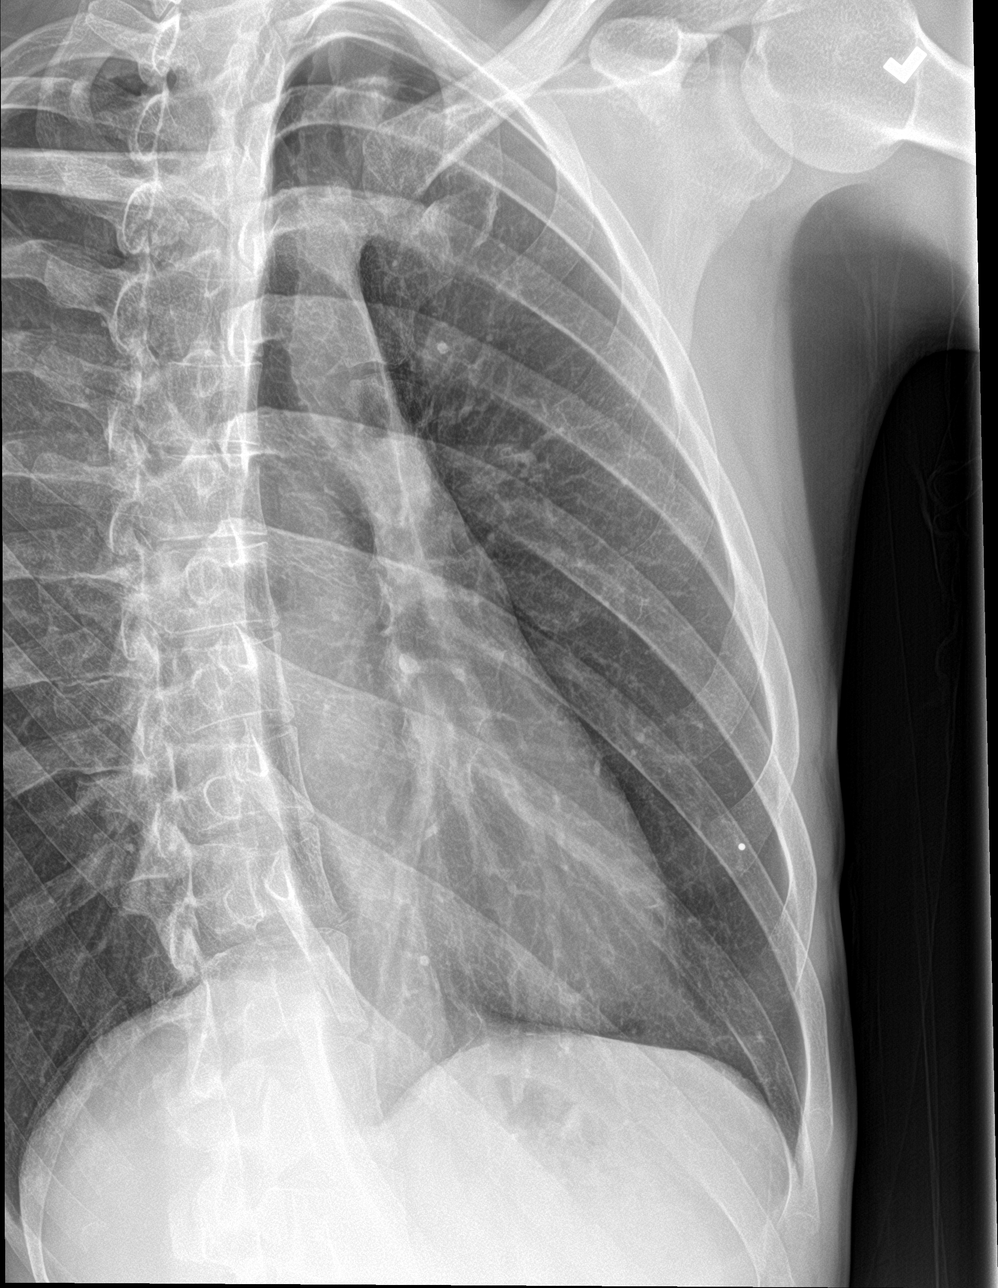

[rib pa obl (2 of 2)]
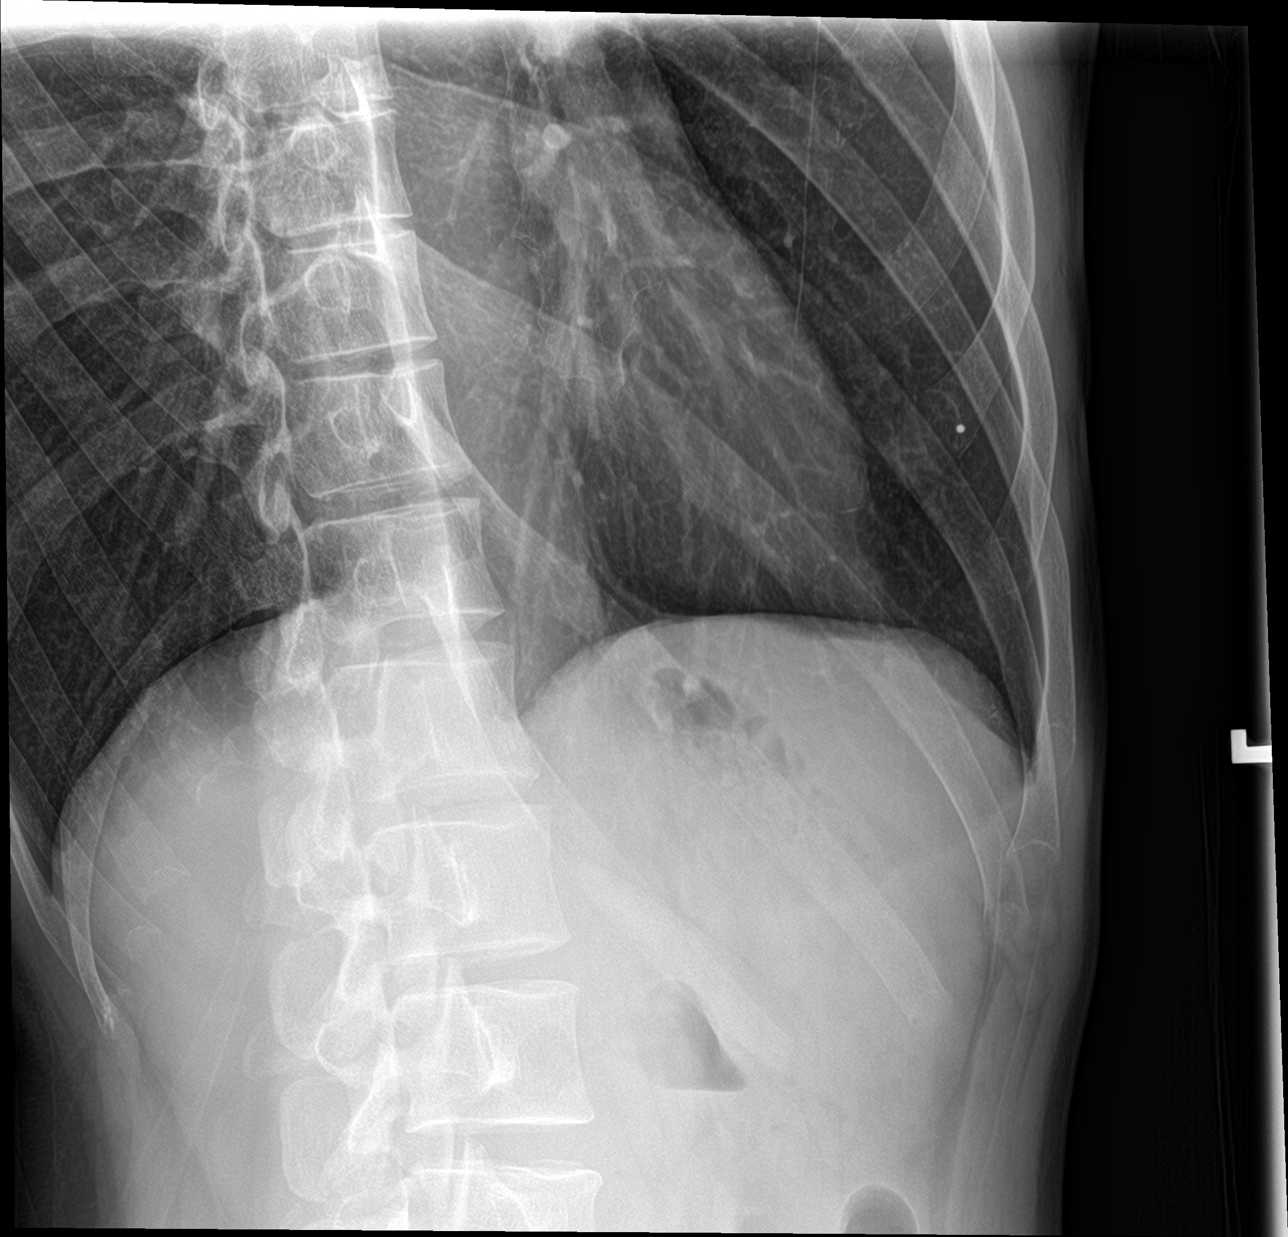

[rib pa]
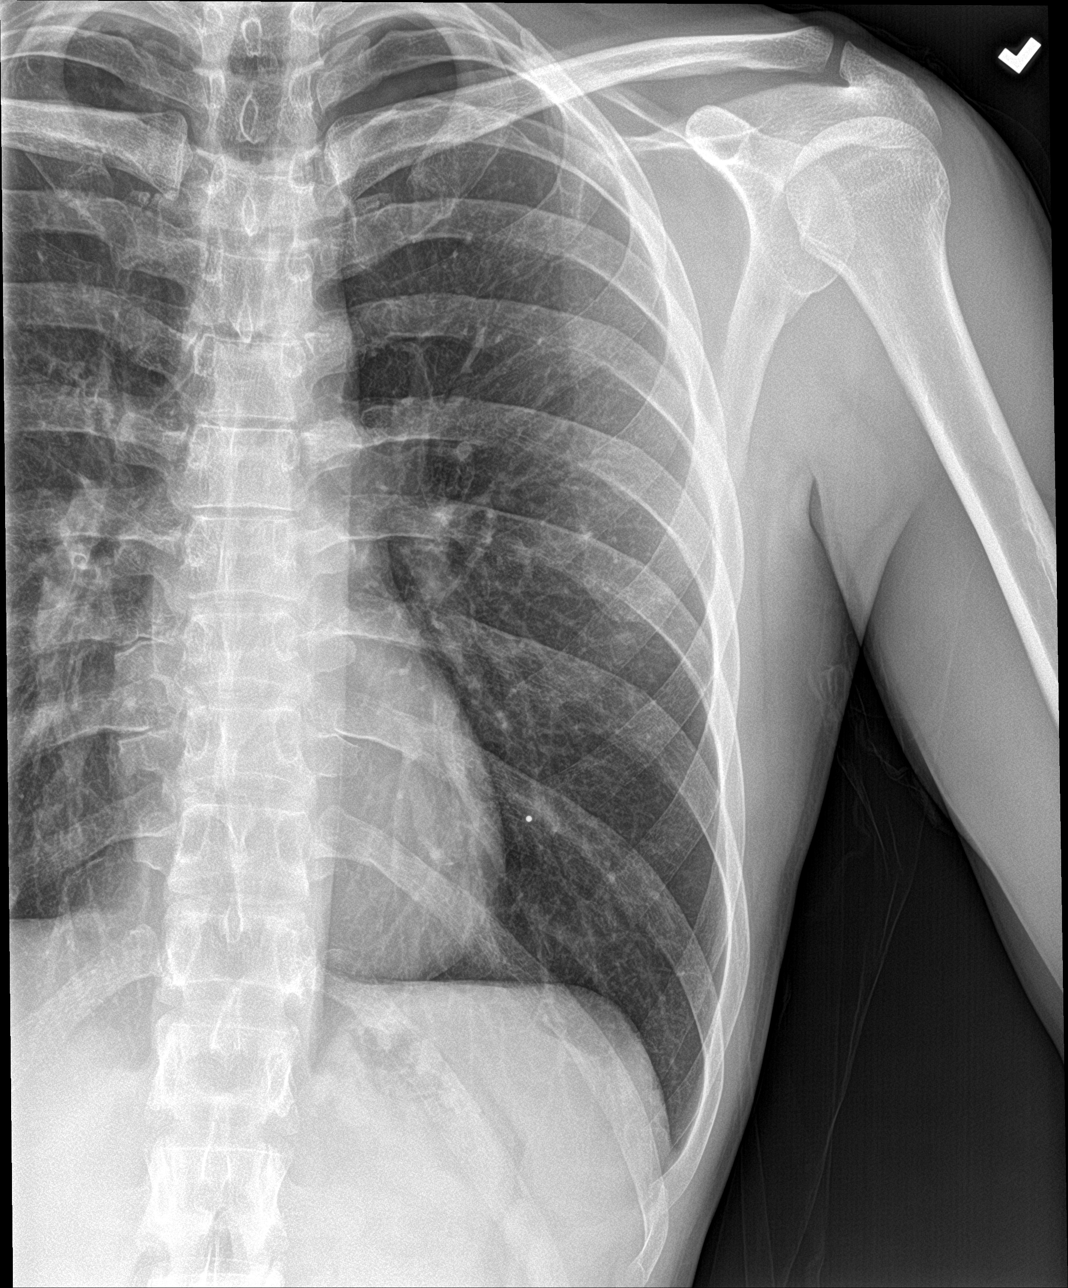

[3 of 3 positions shown; findings below may reference images not displayed]

FINDINGS: No fracture or other bone lesions are seen involving the ribs. A
small left apical pneumothorax is again noted.
IMPRESSION: No evidence of left rib fracture. Small left apical pneumothorax
again seen.

## 2020-01-13 IMAGING — DX DG CHEST 2V
2 series · 2 of 2 positions shown · non-contrast
Comparison: Left rib series of December 05, 2017 and chest x-ray of
the same day.

CLINICAL DATA: Follow-up left-sided pneumothorax.

EXAM:
CHEST - 2 VIEW

[chest pa]
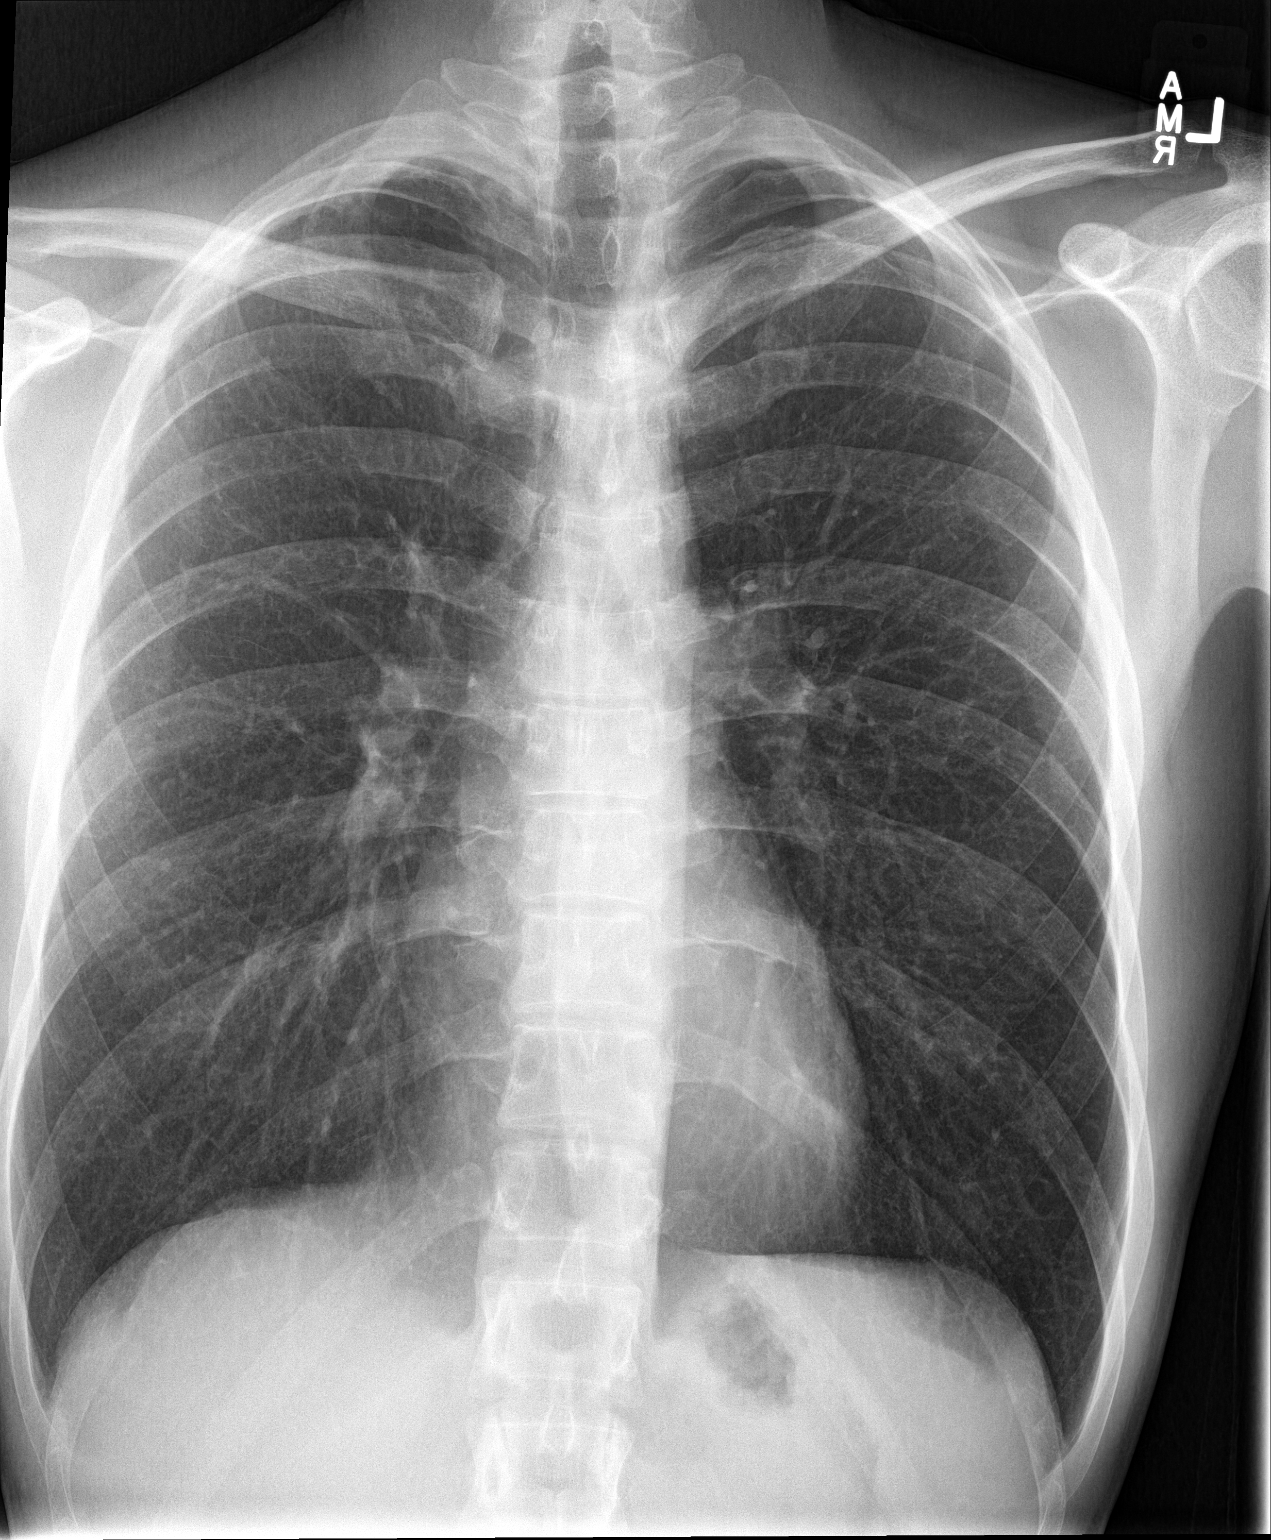

[chest lat]
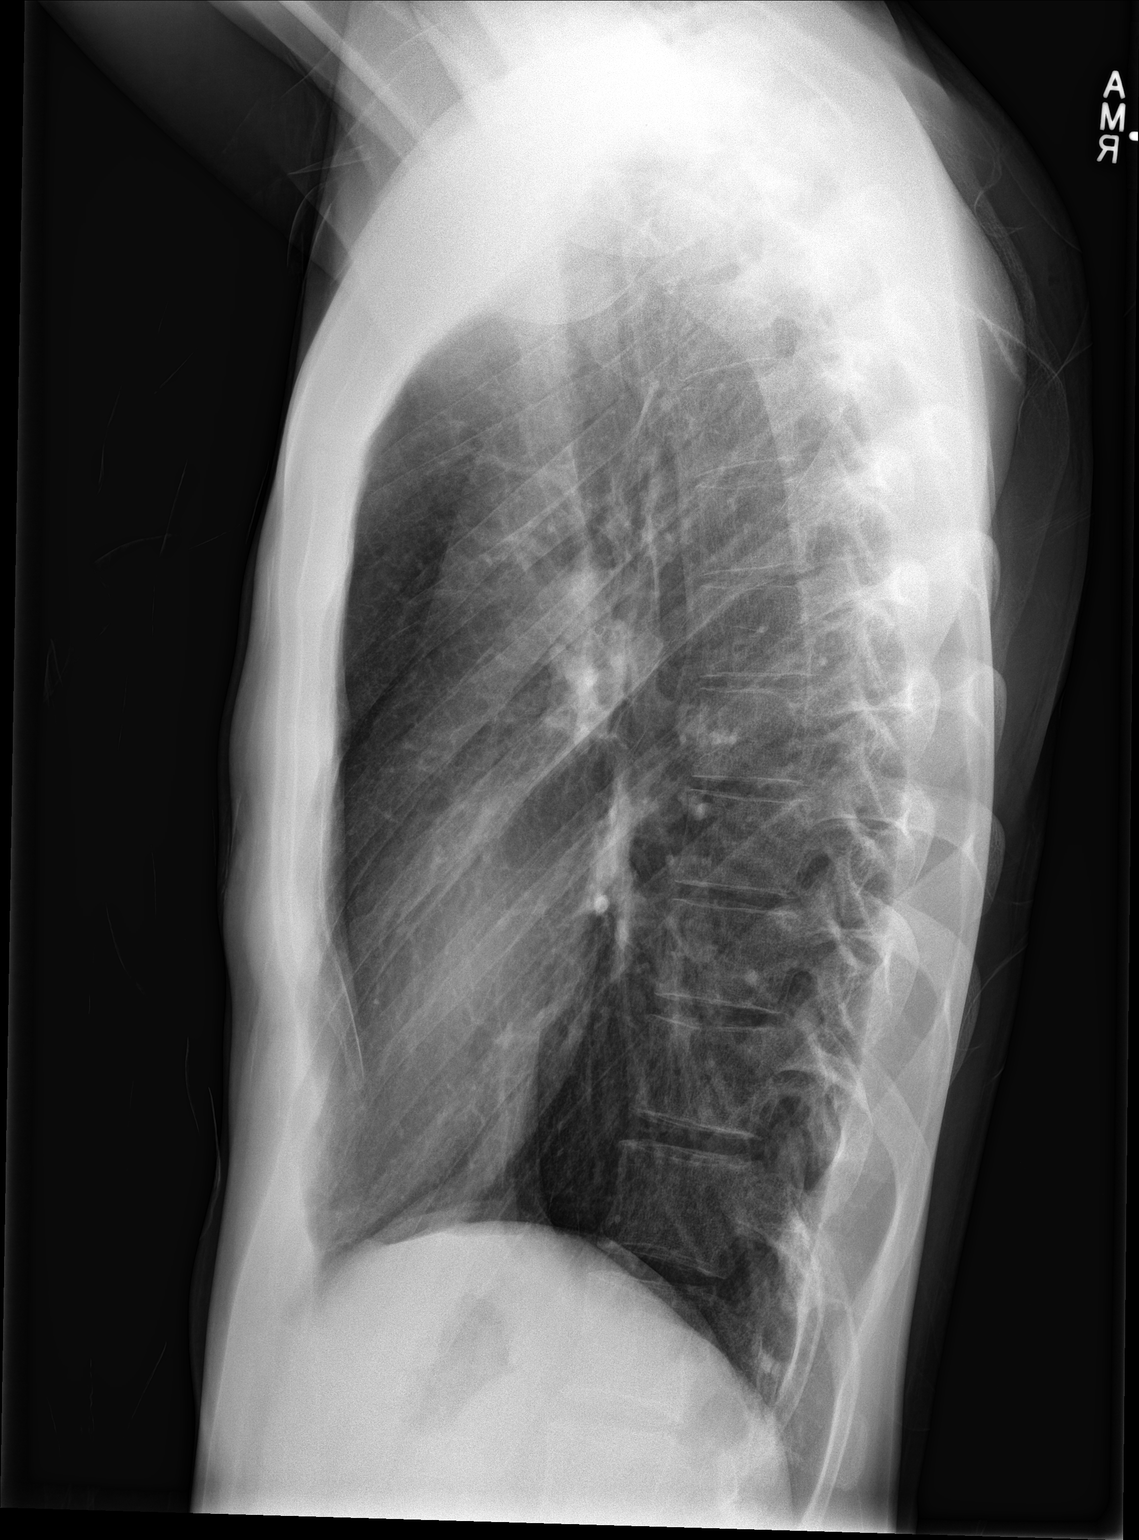

[2 of 2 positions shown; findings below may reference images not displayed]

FINDINGS: There is a persistent 10-15% left apical pneumothorax not
significantly changed since the previous study. There is no
significant subcutaneous emphysema. There is no mediastinal shift.
There is no pleural effusion. The right lung is clear. Both lungs
are mildly hyperinflated the heart and pulmonary vascularity are
normal. No acute left rib fracture is observed.
IMPRESSION: Stable 10-15% left apical pneumothorax.  No pleural effusion.

Underlying reactive airway disease or chronic bronchitis. No
pneumonia nor CHF.

The observed bony thorax is unremarkable.

## 2021-05-17 ENCOUNTER — Other Ambulatory Visit: Payer: Self-pay

## 2021-05-17 ENCOUNTER — Emergency Department (HOSPITAL_COMMUNITY): Payer: Self-pay

## 2021-05-17 ENCOUNTER — Emergency Department (HOSPITAL_COMMUNITY)
Admission: EM | Admit: 2021-05-17 | Discharge: 2021-05-17 | Disposition: A | Discharge status: Home / Self Care | Payer: Self-pay | Attending: Emergency Medicine | Admitting: Emergency Medicine

## 2021-05-17 ENCOUNTER — Encounter (HOSPITAL_COMMUNITY): Payer: Self-pay

## 2021-05-17 DIAGNOSIS — R0602 Shortness of breath: Secondary | ICD-10-CM | POA: Insufficient documentation

## 2021-05-17 DIAGNOSIS — R079 Chest pain, unspecified: Secondary | ICD-10-CM | POA: Insufficient documentation

## 2021-05-17 LAB — CBC WITH DIFFERENTIAL/PLATELET
Abs Immature Granulocytes: 0.01 K/uL (ref 0.00–0.07)
Basophils Absolute: 0.1 K/uL (ref 0.0–0.1)
Basophils Relative: 1 %
Eosinophils Absolute: 0.4 K/uL (ref 0.0–0.5)
Eosinophils Relative: 5 %
HCT: 44.9 % (ref 39.0–52.0)
Hemoglobin: 15 g/dL (ref 13.0–17.0)
Immature Granulocytes: 0 %
Lymphocytes Relative: 20 %
Lymphs Abs: 1.5 K/uL (ref 0.7–4.0)
MCH: 30.6 pg (ref 26.0–34.0)
MCHC: 33.4 g/dL (ref 30.0–36.0)
MCV: 91.6 fL (ref 80.0–100.0)
Monocytes Absolute: 0.5 K/uL (ref 0.1–1.0)
Monocytes Relative: 7 %
Neutro Abs: 5 K/uL (ref 1.7–7.7)
Neutrophils Relative %: 67 %
Platelets: 185 K/uL (ref 150–400)
RBC: 4.9 MIL/uL (ref 4.22–5.81)
RDW: 12.6 % (ref 11.5–15.5)
WBC: 7.4 K/uL (ref 4.0–10.5)
nRBC: 0 % (ref 0.0–0.2)

## 2021-05-17 LAB — BASIC METABOLIC PANEL
Anion gap: 7 (ref 5–15)
BUN: 19 mg/dL (ref 6–20)
CO2: 29 mmol/L (ref 22–32)
Calcium: 8.7 mg/dL — ABNORMAL LOW (ref 8.9–10.3)
Chloride: 104 mmol/L (ref 98–111)
Creatinine, Ser: 1.08 mg/dL (ref 0.61–1.24)
GFR, Estimated: >60 mL/min (ref >60)
Glucose, Bld: 99 mg/dL (ref 70–99)
Potassium: 3.9 mmol/L (ref 3.5–5.1)
Sodium: 140 mmol/L (ref 135–145)

## 2021-05-17 LAB — TROPONIN I (HIGH SENSITIVITY): Troponin I (High Sensitivity): <2 ng/L (ref <18)

## 2021-05-17 MED ORDER — ASPIRIN 81 MG PO CHEW
324.0000 mg | CHEWABLE_TABLET | Freq: Once | ORAL | Status: AC
  Administered 2021-05-17: 324 mg via ORAL
  Filled 2021-05-17: qty 4

## 2021-05-17 NOTE — ED Triage Notes (Signed)
Pt arrives with c/o chest pain for about a year. Pt states that the pain comes and goes at random times. Pt endorses exertional SOB and dizziness/headache.

## 2021-05-17 NOTE — ED Provider Notes (Signed)
[facility] EMERGENCY DEPARTMENT Provider Note   CSN: [id number] Arrival date & time: 05/17/21  1617     History  Chief Complaint  Patient presents with   Chest Pain    Gabriel Graham is a 36 y.o. male.   Chest Pain  This patient is a 36 year old male, he denies any chronic medical conditions, he presents today with a complaint of chest pain and shortness of breath, these things have been intermittent for quite some time in fact he states for the last couple of months he is feeling it fairly frequently.  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  He is not having them right now and has not had them for a couple of hours.  His last time he ate was this morning, he does have a history of smoking cigarettes and currently vapes, his last use of cocaine was a couple of years ago.  He does have a family history of heart disease, he himself has never had heart disease but has never been evaluated for it.  He does have a history of pneumothorax which was treated conservatively, he never followed up and never had a chest tube placement.  Home Medications Prior to Admission medications   Medication Sig Start Date End Date Taking? Authorizing Provider  naproxen (NAPROSYN) 500 MG tablet Take by mouth. 05/11/17  Yes [provider]  HYDROcodone-acetaminophen (NORCO/VICODIN) 5-325 MG tablet Take one tab po q 4 hrs prn pain Patient not taking: Reported on 12/21/2017 12/05/17   Pauline Aus, PA-C      Allergies    Sulfa antibiotics    Review of Systems   Review of Systems  Cardiovascular:  Positive for chest pain.  All other systems reviewed and are negative.  Physical Exam Updated Vital Signs BP 117/76    Pulse 81    Temp (!) 97.5 F (36.4 C) (Oral)    Resp 20    Wt 59 kg    SpO2 99%    BMI 19.77 kg/m  Physical Exam Vitals and nursing note reviewed.   Constitutional:      General: He is not in acute distress.    Appearance: He is well-developed.  HENT:     Head: Normocephalic and atraumatic.     Mouth/Throat:     Pharynx: No oropharyngeal exudate.  Eyes:     General: No scleral icterus.       Right eye: No discharge.        Left eye: No discharge.     Conjunctiva/sclera: Conjunctivae normal.     Pupils: Pupils are equal, round, and reactive to light.  Neck:     Thyroid: No thyromegaly.     Vascular: No JVD.  Cardiovascular:     Rate and Rhythm: Normal rate and regular rhythm.     Heart sounds: Normal heart sounds. No murmur heard.   No friction rub. No gallop.  Pulmonary:     Effort: Pulmonary effort is normal. No respiratory distress.     Breath sounds: Normal breath sounds. No wheezing or rales.  Abdominal:     General: Bowel sounds are normal. There is no distension.     Palpations: Abdomen is soft. There is no mass.     Tenderness: There is no abdominal tenderness.  Musculoskeletal:        General: No tenderness. Normal range  of motion.     Cervical back: Normal range of motion and neck supple.  Lymphadenopathy:     Cervical: No cervical adenopathy.  Skin:    General: Skin is warm and dry.     Findings: No erythema or rash.  Neurological:     Mental Status: He is alert.     Coordination: Coordination normal.  Psychiatric:        Behavior: Behavior normal.    ED Results / Procedures / Treatments   Labs (all labs ordered are listed, but only abnormal results are displayed) Labs Reviewed  BASIC METABOLIC PANEL - Abnormal; Notable for the following components:      Result Value   Calcium 8.7 (*)    All other components within normal limits  CBC WITH DIFFERENTIAL/PLATELET  TROPONIN I (HIGH SENSITIVITY)    EKG EKG Interpretation  Date/Time:  Saturday May 17 2021 16:38:47 EST Ventricular Rate:  66 PR Interval:  137 QRS Duration: 90 QT Interval:  350 QTC Calculation: 367 R Axis:   44 Text  Interpretation: Sinus rhythm Normal ECG Since last tracing rate faster Confirmed by Eber Hong ([id number]) on 05/17/2021 4:48:23 PM  Radiology DG Chest 2 View  Result Date: 05/17/2021 CLINICAL DATA:  PT c/o cp, SOB and dizziness/headache for about a year. Current smoker. EXAM: CHEST - 2 VIEW COMPARISON:  12/21/2017. FINDINGS: Normal heart, mediastinum and hila. Clear lungs.  No pleural effusion or pneumothorax. Skeletal structures are unremarkable. IMPRESSION: No active cardiopulmonary disease. Electronically Signed   By: Amie Portland M.D.   On: 05/17/2021 17:24    Procedures Procedures    Medications Ordered in ED Medications  aspirin chewable tablet 324 mg (324 mg Oral Given 05/17/21 1711)    ED Course/ Medical Decision Making/ A&P                           Medical Decision Making This patient is seemingly low to intermediate risk for heart disease, he has a totally normal EKG which shows no signs of ischemia or arrhythmia.  I have compared this to prior EKGs and there is no change.  We will proceed with lab work to rule out elevated troponin and a chest x-ray to rule out pneumothorax.  Other things on the differential diagnosis would include pulmonary embolism though less likely given no tachycardia or hypoxia, would also consider aortic dissection but this seems extremely unlikely given the pattern of his pain.  This could be related to anxiety, drug use, though seem less likely as well as he seems to be very comfortable at this time.  There is a strong family history of heart disease thus he will likely need to see a cardiologist for stress test even if his tests are normal.  Amount and/or Complexity of Data Reviewed Independent Historian: friend    Details: Reports that he has been complaining of chest pain intermittently for some External Data Reviewed: notes.    Details: There are no prior emergency department notes or inpatient notes or cardiology clinic notes Labs: ordered.     Details: The laboratory work-up shows negative troponin, totally undetectable, laboratory work-up is reassuring Radiology: ordered and independent interpretation performed.    Details: X-rays of personally viewed and examined, my interpretation is that there is no signs of edema, pneumonia or pneumothorax, no abnormal mediastinum, totally normal EKG ECG/medicine tests: ordered and independent interpretation performed.    Details: EKG is unremarkable and shows no signs of  ischemia or arrhythmia Discussion of management or test interpretation with external provider(s): Discussed with the patient that he does not need to be admitted to the hospital though would we did consider that he could have a life-threatening cause of chest pain, ultimately the testing showed that he did not  Recommended follow-up with cardiology, the patient is agreeable  Risk OTC drugs. Decision regarding hospitalization.          Final Clinical Impression(s) / ED Diagnoses Final diagnoses:  Chest pain, unspecified type    Rx / DC Orders ED Discharge Orders     None         Eber HongMiller, Khayree Delellis, MD 05/17/21 1818

## 2021-05-17 NOTE — Discharge Instructions (Addendum)
Your testing has been totally normal, I would like for you to be evaluated by your family doctor as well as a cardiologist, see the phone number above for the cardiologist locally.  Return to the emergency department for severe or worsening symptoms otherwise you can go to the cardiologist within the next 7 to 10 days, you may need a stress test.  Start taking a aspirin every day  [facility] Primary Care Doctor List    Syliva Overman, MD. Specialty: [facility] Medicine Contact information: [address]  [place] [address]  [phone]   Lilyan Punt, MD. Specialty: Family Medicine Contact information: [address]  [place] [address]  [phone]   Avon Gully, MD Specialty: Internal Medicine Contact information: [address] [place] [address]  [phone]   Catalina Pizza, MD. Specialty: Internal Medicine Contact information: [address]  [place] [address]  [phone]    [facility] (Dr. Selena Batten) Specialty: Family Medicine Contact information: [address]  [place] [address]  [phone]   John Giovanni, MD. Specialty: [facility] Medicine Contact information: [address]  [address]  [place] [address]  [phone]   Carylon Perches, MD. Specialty: Internal Medicine Contact information: [address]  [address]  [place] [address]  [phone]    [facility]  [address] [place], [place] [address] [phone]  Services [facility] offers a variety of basic health services.  Services include but are not limited to: Blood pressure checks  Heart rate checks  Blood sugar checks  Urine analysis  Rapid strep tests  Pregnancy tests.  Health education and referrals  People needing more complex services will be directed to a physician online. Using these virtual visits, doctors can evaluate and prescribe medicine and  treatments. There will be no medication on-site, though [facility] will help patients fill their prescriptions at little to no cost.   For More information please go to: [email or web address]

## 2021-10-01 ENCOUNTER — Ambulatory Visit: Payer: Self-pay | Admitting: Cardiology

## 2021-12-19 ENCOUNTER — Encounter: Payer: Self-pay | Admitting: Cardiology

## 2021-12-19 ENCOUNTER — Ambulatory Visit (INDEPENDENT_AMBULATORY_CARE_PROVIDER_SITE_OTHER): Payer: No Typology Code available for payment source | Admitting: Cardiology

## 2021-12-19 VITALS — BP 100/64 | HR 100 | Ht 68.0 in | Wt 128.0 lb

## 2021-12-19 DIAGNOSIS — R079 Chest pain, unspecified: Secondary | ICD-10-CM

## 2021-12-19 DIAGNOSIS — R0609 Other forms of dyspnea: Secondary | ICD-10-CM

## 2021-12-19 NOTE — Patient Instructions (Signed)
Medication Instructions:  Your physician recommends that you continue on your current medications as directed. Please refer to the Current Medication list given to you today.   Labwork: None  Testing/Procedures: Your physician has requested that you have an echocardiogram. Echocardiography is a painless test that uses sound waves to create images of your heart. It provides your doctor with information about the size and shape of your heart and how well your heart's chambers and valves are working. This procedure takes approximately one hour. There are no restrictions for this procedure.   Follow-Up: Follow up pending test results.   Any Other Special Instructions Will Be Listed Below (If Applicable).     If you need a refill on your cardiac medications before your next appointment, please call your pharmacy.  

## 2021-12-19 NOTE — Progress Notes (Signed)
Clinical Summary Gabriel Graham is a 36 y.o.male seen as a new consult, referred by NP Hyacinth Meeker for the following medical problems  1.Chest pain -ER visit Jan 2023 with chest pain - trop neg, EKG SR no ischemic changes  - started late last year - left sided, can be sharp or pressing like feeling. 4-5/10 in severity, isolated 9/10 episode. Can occur at rest or with activity. Lasts a few minutes. Occurs 1-2 times per month - recent DOE with activities - no recent edema  CAD risk factors: former smoker x 13 years quit 10 years ago. Father MI mid 76s   2. Prior history of pneumothorax  SH: works as [employer]  Past Medical History:  Diagnosis Date   Allergy    RHINITIS   Kidney stone    05/29/16    Smoker      Allergies  Allergen Reactions   Sulfa Antibiotics Anaphylaxis and Rash     Current Outpatient Medications  Medication Sig Dispense Refill   HYDROcodone-acetaminophen (NORCO/VICODIN) 5-325 MG tablet Take one tab po q 4 hrs prn pain (Patient not taking: Reported on 12/21/2017) 15 tablet 0   naproxen (NAPROSYN) 500 MG tablet Take by mouth.     No current facility-administered medications for this visit.     Past Surgical History:  Procedure Laterality Date   EXTRACORPOREAL SHOCK WAVE LITHOTRIPSY Left 06/01/2016   Procedure: EXTRACORPOREAL SHOCK WAVE LITHOTRIPSY (ESWL);  Surgeon: Ihor Gully, MD;  Location: [facility];  Service: Urology;  Laterality: Left;  [id number] [phone]   HYDROCELE EXCISION       Allergies  Allergen Reactions   Sulfa Antibiotics Anaphylaxis and Rash      Family History  Problem Relation Age of Onset   Diabetes Maternal Grandmother    Emphysema Paternal Grandmother      Social History Mr. Fye reports that he has quit smoking. His smoking use included cigarettes. He smoked an average of 1 pack per day. He has never used smokeless tobacco. Mr. Phaneuf reports that he does not currently use alcohol.   Review of  Systems CONSTITUTIONAL: No weight loss, fever, chills, weakness or fatigue.  HEENT: Eyes: No visual loss, blurred vision, double vision or yellow sclerae.No hearing loss, sneezing, congestion, runny nose or sore throat.  SKIN: No rash or itching.  CARDIOVASCULAR: per hpi RESPIRATORY: per hpi GASTROINTESTINAL: No anorexia, nausea, vomiting or diarrhea. No abdominal pain or blood.  GENITOURINARY: No burning on urination, no polyuria NEUROLOGICAL: No headache, dizziness, syncope, paralysis, ataxia, numbness or tingling in the extremities. No change in bowel or bladder control.  MUSCULOSKELETAL: No muscle, back pain, joint pain or stiffness.  LYMPHATICS: No enlarged nodes. No history of splenectomy.  PSYCHIATRIC: No history of depression or anxiety.  ENDOCRINOLOGIC: No reports of sweating, cold or heat intolerance. No polyuria or polydipsia.  Marland Kitchen   Physical Examination Today's Vitals   12/19/21 1332  BP: 100/64  Pulse: 100  SpO2: 97%  Weight: 128 lb (58.1 kg)  Height: 5\' 8"  (1.727 m)   Body mass index is 19.46 kg/m.  Gen: resting comfortably, no acute distress HEENT: no scleral icterus, pupils equal round and reactive, no palptable cervical adenopathy,  CV: RRR, no m/r/ gno jvd Resp: Clear to auscultation bilaterally GI: abdomen is soft, non-tender, non-distended, normal bowel sounds, no hepatosplenomegaly MSK: extremities are warm, no edema.  Skin: warm, no rash Neuro:  no focal deficits Psych: appropriate affect    Assessment and Plan  1.Chest pain/DOE - recent  chest pains, DOE of unclear etiology - will plan for initial echo, pending results would likely consider GXT - EKG today shows SR, no acute ischemic changes.       Antoine Poche, M.D.

## 2021-12-22 ENCOUNTER — Ambulatory Visit (INDEPENDENT_AMBULATORY_CARE_PROVIDER_SITE_OTHER): Payer: No Typology Code available for payment source

## 2021-12-22 DIAGNOSIS — R079 Chest pain, unspecified: Secondary | ICD-10-CM

## 2021-12-22 LAB — ECHOCARDIOGRAM COMPLETE
AR max vel: 2.36 cm2
AV Peak grad: 5.9 mmHg
Ao pk vel: 1.21 m/s
Area-P 1/2: 2.36 cm2
Calc EF: 67.3 %
MV M vel: 4.21 m/s
MV Peak grad: 70.9 mmHg
S' Lateral: 2.75 cm
Single Plane A2C EF: 67.3 %
Single Plane A4C EF: 66.3 %

## 2022-01-07 ENCOUNTER — Telehealth: Payer: Self-pay

## 2022-01-07 DIAGNOSIS — R079 Chest pain, unspecified: Secondary | ICD-10-CM

## 2022-01-07 NOTE — Telephone Encounter (Signed)
Results discussed with patient.GXT ordered, patient knows to wear comfortable clothing appropriate for exercise and to be NPO 4 hours prior to test. He takes no medications.

## 2022-01-07 NOTE — Telephone Encounter (Signed)
-----   Message from Lesle Chris, LPN sent at 10/09/5447  5:47 PM EDT -----  ----- Message ----- From: Antoine Poche, MD Sent: 01/06/2022  10:05 AM EDT To: Lesle Chris, LPN  Normal echo, please order GXT for chest pain   Dominga Ferry MD

## 2022-01-16 ENCOUNTER — Encounter (HOSPITAL_COMMUNITY): Payer: No Typology Code available for payment source

## 2022-01-23 ENCOUNTER — Ambulatory Visit (HOSPITAL_COMMUNITY): Payer: No Typology Code available for payment source

## 2022-02-06 ENCOUNTER — Encounter (HOSPITAL_COMMUNITY): Payer: No Typology Code available for payment source

## 2023-01-12 ENCOUNTER — Encounter (HOSPITAL_BASED_OUTPATIENT_CLINIC_OR_DEPARTMENT_OTHER): Payer: Self-pay

## 2023-01-12 ENCOUNTER — Emergency Department (HOSPITAL_BASED_OUTPATIENT_CLINIC_OR_DEPARTMENT_OTHER)
Admission: EM | Admit: 2023-01-12 | Discharge: 2023-01-13 | Disposition: A | Discharge status: Home / Self Care | Payer: No Typology Code available for payment source | Attending: Emergency Medicine | Admitting: Emergency Medicine

## 2023-01-12 ENCOUNTER — Emergency Department (HOSPITAL_COMMUNITY)
Admission: EM | Admit: 2023-01-12 | Discharge: 2023-01-12 | Discharge status: Against Medical Advice | Payer: No Typology Code available for payment source | Source: Home / Self Care

## 2023-01-12 ENCOUNTER — Other Ambulatory Visit: Payer: Self-pay

## 2023-01-12 DIAGNOSIS — S0501XA Injury of conjunctiva and corneal abrasion without foreign body, right eye, initial encounter: Secondary | ICD-10-CM | POA: Insufficient documentation

## 2023-01-12 DIAGNOSIS — W228XXA Striking against or struck by other objects, initial encounter: Secondary | ICD-10-CM | POA: Insufficient documentation

## 2023-01-12 MED ORDER — TETRACAINE HCL 0.5 % OP SOLN
2.0000 [drp] | Freq: Once | OPHTHALMIC | Status: AC
  Administered 2023-01-13: 2 [drp] via OPHTHALMIC
  Filled 2023-01-12: qty 4

## 2023-01-12 MED ORDER — FLUORESCEIN SODIUM 1 MG OP STRP
1.0000 | ORAL_STRIP | Freq: Once | OPHTHALMIC | Status: AC
  Administered 2023-01-13: 1 via OPHTHALMIC
  Filled 2023-01-12: qty 1

## 2023-01-12 NOTE — ED Notes (Signed)
Pt called x2 with no response °

## 2023-01-12 NOTE — ED Notes (Signed)
[employer], [employer], Fluorescein strip and tetracaine at bedside.

## 2023-01-12 NOTE — ED Triage Notes (Signed)
Patient arrives to ED POV C/O Right eye pain. Patient states that he was cutting wood with a chainsaw and a piece wood was lodge into his eye. Redness and swelling is noted to Pts RT eye and is unable to open eye due to pain. Pt A/O x4. No other complaints at this time.

## 2023-01-13 MED ORDER — HYDROCODONE-ACETAMINOPHEN 5-325 MG PO TABS
2.0000 | ORAL_TABLET | Freq: Once | ORAL | Status: AC
  Administered 2023-01-13: 2 via ORAL
  Filled 2023-01-13: qty 2

## 2023-01-13 MED ORDER — HYDROCODONE-ACETAMINOPHEN 5-325 MG PO TABS
1.0000 | ORAL_TABLET | Freq: Four times a day (QID) | ORAL | 0 refills | Status: AC | PRN
Start: 2023-01-13 — End: ?

## 2023-01-13 MED ORDER — TOBRAMYCIN 0.3 % OP SOLN
2.0000 [drp] | Freq: Once | OPHTHALMIC | Status: AC
Start: 2023-01-13 — End: 2023-01-13
  Administered 2023-01-13: 2 [drp] via OPHTHALMIC
  Filled 2023-01-13: qty 5

## 2023-01-13 NOTE — ED Provider Notes (Signed)
  [place] [facility] AT [facility] Provider Note   CSN: [id number] Arrival date & time: 01/12/23  2245     History  Chief Complaint  Patient presents with   Eye Injury    Gabriel Graham is a 37 y.o. male.  Patient is a 37 year old male with no significant past medical history.  Patient presents with an eye injury.  He was operating a chainsaw when a branch he was cutting snapped back and struck him in the eye.  He describes pain to the right eye and difficulty opening it.  He denies any visual disturbances.  He has a foreign body sensation to the right eye.  The history is provided by the patient.       Home Medications Prior to Admission medications   Not on File      Allergies    Sulfa antibiotics    Review of Systems   Review of Systems  All other systems reviewed and are negative.   Physical Exam Updated Vital Signs BP 123/80 (BP Location: Left Arm)   Pulse 74   Temp 97.6 F (36.4 C) (Oral)   Resp 20   Ht 5\' 8"  (1.727 m)   Wt 65.8 kg   SpO2 100%   BMI 22.05 kg/m  Physical Exam Vitals and nursing note reviewed.  Constitutional:      Appearance: Normal appearance.  Eyes:     Pupils: Pupils are equal, round, and reactive to light.     Comments: The right thigh is grossly normal in appearance.  Anterior chamber is clear without hyphema.  Pupil is round and reactive.  With fluorescein staining, there is a moderate-sized abrasion noted at 6:00  Pulmonary:     Effort: Pulmonary effort is normal.  Skin:    General: Skin is warm and dry.  Neurological:     Mental Status: He is alert and oriented to person, place, and time.     ED Results / Procedures / Treatments   Labs (all labs ordered are listed, but only abnormal results are displayed) Labs Reviewed - No data to display  EKG None  Radiology No results found.  Procedures Procedures    Medications Ordered in ED Medications  tetracaine (PONTOCAINE) 0.5 % ophthalmic  solution 2 drop (has no administration in time range)  fluorescein ophthalmic strip 1 strip (has no administration in time range)  HYDROcodone-acetaminophen (NORCO/VICODIN) 5-325 MG per tablet 2 tablet (has no administration in time range)  tobramycin (TOBREX) 0.3 % ophthalmic solution 2 drop (has no administration in time range)    ED Course/ Medical Decision Making/ A&P  Patient presenting with an eye injury as described in the HPI.  He has a corneal abrasion to the inferior aspect of the cornea, but no foreign body in the cornea or beneath the eyelids.  Patient to be treated with tobramycin eyedrops, pain medication, and follow-up as needed if symptoms worsen.  Final Clinical Impression(s) / ED Diagnoses Final diagnoses:  None    Rx / DC Orders ED Discharge Orders     None         Geoffery Lyons, MD 01/13/23 0002

## 2023-01-13 NOTE — Discharge Instructions (Addendum)
Use tobramycin eyedrops, 2 drops every 4 hours while awake for the next several days.  Take ibuprofen 600 mg every 6 hours as needed for pain.  Take hydrocodone as prescribed as needed for pain not relieved with ibuprofen.  Return to the emergency department if you develop worsening pain, worsening vision, drainage, or for other new and concerning symptoms.
